# Patient Record
Sex: Female | Born: 1970
Health system: Southern US, Community
[De-identification: ages and names within clinical notes are randomized; demographics above are authoritative.]

## PROBLEM LIST (undated history)

## (undated) DIAGNOSIS — G43909 Migraine, unspecified, not intractable, without status migrainosus: Secondary | ICD-10-CM

## (undated) DIAGNOSIS — T7840XA Allergy, unspecified, initial encounter: Secondary | ICD-10-CM

## (undated) HISTORY — PX: ENDOMETRIAL ABLATION: SHX621

## (undated) HISTORY — PX: OTHER SURGICAL HISTORY: SHX169

## (undated) HISTORY — DX: Allergy, unspecified, initial encounter: T78.40XA

---

## 2003-04-19 ENCOUNTER — Emergency Department (HOSPITAL_COMMUNITY): Admission: EM | Admit: 2003-04-19 | Discharge: 2003-04-19 | Payer: Self-pay | Admitting: Emergency Medicine

## 2015-06-15 ENCOUNTER — Emergency Department (HOSPITAL_COMMUNITY)
Admission: EM | Admit: 2015-06-15 | Discharge: 2015-06-15 | Disposition: A | Discharge status: Home / Self Care | Payer: PRIVATE HEALTH INSURANCE | Source: Home / Self Care | Attending: Emergency Medicine | Admitting: Emergency Medicine

## 2015-06-15 ENCOUNTER — Encounter (HOSPITAL_COMMUNITY): Payer: Self-pay | Admitting: Emergency Medicine

## 2015-06-15 DIAGNOSIS — J0100 Acute maxillary sinusitis, unspecified: Secondary | ICD-10-CM

## 2015-06-15 HISTORY — DX: Migraine, unspecified, not intractable, without status migrainosus: G43.909

## 2015-06-15 LAB — POCT RAPID STREP A: Streptococcus, Group A Screen (Direct): NEGATIVE

## 2015-06-15 MED ORDER — BENZONATATE 100 MG PO CAPS: 100.0000 mg | ORAL_CAPSULE | Freq: Three times a day (TID) | ORAL | Status: DC | PRN

## 2015-06-15 MED ORDER — AZITHROMYCIN 250 MG PO TABS: ORAL_TABLET | ORAL | Status: DC

## 2015-06-15 MED ORDER — IPRATROPIUM BROMIDE 0.06 % NA SOLN: 2.0000 | Freq: Four times a day (QID) | NASAL | Status: DC

## 2015-06-15 NOTE — ED Notes (Signed)
C/o sinus pain States she has facial pain and pressure States she is congested, has sore throat and drainage Aleve used as tx

## 2015-06-15 NOTE — Discharge Instructions (Signed)
You have a sinus infection. Use Atrovent nasal spray 4 times a day to help dry up secretions and decreased swelling. Take azithromycin as prescribed. Use Tessalon as needed for cough. You should see improvement over the next 2 days. Follow-up as needed.

## 2015-06-15 NOTE — ED Provider Notes (Signed)
CSN: [id number]     Arrival date & time 06/15/15  1303 History   First MD Initiated Contact with Patient 06/15/15 1333     Chief Complaint  Patient presents with  . Facial Pain   (Consider location/radiation/quality/duration/timing/severity/associated sxs/prior Treatment) HPI  She is a 44 year old woman here for evaluation of sinus pain. She states for the last 8 days she has had nasal congestion and sinus pressure. This is associated with postnasal drainage and a sore throat. She also reports a cough. No ear pain, but she states her ears pop. No fevers. No nausea or vomiting. No shortness of breath. She has tried Advil Cold and Sinus, which she states typically helps, but it has not done anything.  Past Medical History  Diagnosis Date  . Migraines    Past Surgical History  Procedure Laterality Date  . Labation     History reviewed. No pertinent family history. History  Substance Use Topics  . Smoking status: Not on file  . Smokeless tobacco: Not on file  . Alcohol Use: Not on file   OB History    No data available     Review of Systems As in history of present illness Allergies  Review of patient's allergies indicates no known allergies.  Home Medications   Prior to Admission medications   Medication Sig Start Date End Date Taking? Authorizing Provider  azithromycin (ZITHROMAX Z-PAK) 250 MG tablet Take 2 pills today, then 1 pill daily until gone. 06/15/15   Melony Overly, MD  benzonatate (TESSALON) 100 MG capsule Take 1 capsule (100 mg total) by mouth 3 (three) times daily as needed for cough. 06/15/15   Melony Overly, MD  ipratropium (ATROVENT) 0.06 % nasal spray Place 2 sprays into both nostrils 4 (four) times daily. 06/15/15   Melony Overly, MD   BP 145/94 mmHg  Pulse 80  Temp(Src) 98 F (36.7 C) (Oral)  Resp 16  SpO2 96% Physical Exam  Constitutional: She is oriented to person, place, and time. She appears well-developed and well-nourished. No distress.  HENT:   Mouth/Throat: No oropharyngeal exudate.  Clear post nasal drip seen. Nasal mucosa is erythematous and boggy. Right TM is retracted, left TM is normal. Bilateral maxillary tenderness. No frontal tenderness.  Eyes: Conjunctivae are normal.  Neck: Neck supple.  Cardiovascular: Normal rate, regular rhythm and normal heart sounds.   No murmur heard. Pulmonary/Chest: Effort normal and breath sounds normal. No respiratory distress. She has no wheezes. She has no rales.  Lymphadenopathy:    She has no cervical adenopathy.  Neurological: She is alert and oriented to person, place, and time.    ED Course  Procedures (including critical care time) Labs Review Labs Reviewed  POCT RAPID STREP A    Imaging Review No results found.   MDM   1. Acute maxillary sinusitis, recurrence not specified    Treatment with Atrovent, azithromycin, and Tessalon. Follow-up as needed.    Melony Overly, MD 06/15/15 [phone]

## 2015-06-17 LAB — CULTURE, GROUP A STREP: Strep A Culture: NEGATIVE

## 2015-06-17 NOTE — ED Notes (Signed)
Final report of strep test negative

## 2019-03-19 ENCOUNTER — Encounter: Payer: Self-pay | Admitting: Sports Medicine

## 2019-03-19 ENCOUNTER — Ambulatory Visit
Admission: RE | Admit: 2019-03-19 | Discharge: 2019-03-19 | Disposition: A | Discharge status: Home / Self Care | Payer: PRIVATE HEALTH INSURANCE | Source: Ambulatory Visit | Attending: Sports Medicine | Admitting: Sports Medicine

## 2019-03-19 ENCOUNTER — Ambulatory Visit: Payer: PRIVATE HEALTH INSURANCE | Admitting: Sports Medicine

## 2019-03-19 ENCOUNTER — Other Ambulatory Visit: Payer: Self-pay

## 2019-03-19 VITALS — BP 122/78 | Ht 66.0 in | Wt 182.0 lb

## 2019-03-19 DIAGNOSIS — M25561 Pain in right knee: Secondary | ICD-10-CM

## 2019-03-19 MED ORDER — MELOXICAM 15 MG PO TABS: ORAL_TABLET | ORAL | 0 refills | Status: DC

## 2019-03-19 NOTE — Addendum Note (Signed)
Addended by: Cyd Silence on: 03/19/2019 09:44 AM   Modules accepted: Orders

## 2019-03-19 NOTE — Progress Notes (Addendum)
   Subjective:    Patient ID: Christine Estrada, female    DOB: [birth year], 48 y.o.   MRN: [id number]  HPI chief complaint: Right knee pain  Very pleasant 48 year old female comes in today after having injured her right knee about 1 week ago.  While doing a Cross[employer] workout, she did a backwards lunge with the right leg and felt a painful pop in the medial aspect of the right knee.  Since that time she is had persistent pain with certain activities.  She notes that she is able to run with minimal discomfort but walking is bothersome.  She also notes that any sort of valgus force on the knee will cause discomfort.  She has not noticed any swelling but does endorse stiffness in the posterior knee.  She denies problems with this knee in the past.  No prior knee surgeries.  No specific treatment to date.  Past medical history reviewed Medications reviewed Allergies reviewed    Review of Systems As above    Objective:   Physical Exam  Well-developed, well-nourished.  No acute distress.  Awake alert and oriented x3.  Vital signs reviewed  Right knee: Full range of motion.  No obvious effusion.  She is tender to palpation along the medial joint line with a positive McMurray's.  Positive medial Thessaly's.  Knee is stable to valgus and varus stressing.  Negative anterior drawer, negative posterior drawer.  1+ patellofemoral crepitus.  Neurovascular intact distally.  Bedside ultrasound was performed.  Patient has a trace joint effusion.  Lateral meniscus was difficult to visualize but there does appear to be fluid at the lateral joint space.       Assessment & Plan:   Right knee pain likely secondary to medial meniscal tear  We will start conservatively with Mobic 15 mg daily for 7 days, body helix compression sleeve with activity, and isometric quad exercises.  Patient will modify her workouts using pain as her guide and will follow-up with me in 3 weeks for reevaluation.  I would like to go  ahead and get an x-ray of her knee to rule out osteoarthritis.  If symptoms persist, consider merits of further diagnostic imaging to rule out medial meniscal tear.  Addendum: X-ray unremarkable

## 2019-04-14 ENCOUNTER — Ambulatory Visit: Payer: PRIVATE HEALTH INSURANCE | Admitting: Sports Medicine

## 2019-04-15 ENCOUNTER — Ambulatory Visit: Payer: PRIVATE HEALTH INSURANCE | Admitting: Sports Medicine

## 2019-04-15 ENCOUNTER — Encounter: Payer: Self-pay | Admitting: Sports Medicine

## 2019-04-15 ENCOUNTER — Other Ambulatory Visit: Payer: Self-pay

## 2019-04-15 VITALS — BP 118/84 | Ht 66.0 in | Wt 180.0 lb

## 2019-04-15 DIAGNOSIS — M25561 Pain in right knee: Secondary | ICD-10-CM | POA: Diagnosis not present

## 2019-04-15 MED ORDER — MELOXICAM 15 MG PO TABS: ORAL_TABLET | ORAL | 0 refills | Status: DC

## 2019-04-15 NOTE — Progress Notes (Signed)
   Subjective:    Patient ID: Christine Estrada, female    DOB: Dec 11, 1970, 48 y.o.   MRN: [id number]  HPI  Patient comes in today for follow-up on a right knee injury which she suffered last month.  Overall, she feels like her symptoms are improving.  She still endorses a painful pop along the medial knee with walking occasionally.  Still having pain with certain exercises such as backward lunges.  The meloxicam has been helpful.  The stiffness she was experiencing previously has resolved with the meloxicam.  He is taking it about 4 times a week.  She denies stomach upset.  She has not noticed any swelling around the knee.  She does wear the body helix compression sleeve when exercising and has been doing her home exercises.   Review of Systems    As above Objective:   Physical Exam  Well-developed, well-nourished.  No acute distress.  Awake alert and oriented x3.  Vital signs reviewed.  Right knee: Full range of motion.  No obvious effusion.  She remains tender to palpation along the medial joint line with a positive McMurray's and a positive Thessaly's.  No tenderness along the lateral joint line.  Knee is grossly stable to ligamentous exam.  Neurovascularly intact distally.  Walking without an obvious limp.  X-rays of the right knee including AP, lateral, and sunrise views show minimal degenerative changes.  Nothing acute.      Assessment & Plan:   Right knee pain likely secondary to medial meniscal tear  I discussed work-up and treatment including continuing with her current plan of oral anti-inflammatories, compression sleeve, home exercises, and activity modification versus further diagnostic imaging in the form of an MRI.  For now, we are going to continue with meloxicam taking it as needed with food.  She will continue with her body helix compression sleeve and her home exercise program.  If her symptoms do not continue to improve over the next 3 to 4 weeks, she will notify me and  we will reconsider merits of an MRI.  Follow-up as needed.

## 2019-10-21 DIAGNOSIS — F329 Major depressive disorder, single episode, unspecified: Secondary | ICD-10-CM | POA: Diagnosis not present

## 2019-10-21 DIAGNOSIS — F419 Anxiety disorder, unspecified: Secondary | ICD-10-CM | POA: Diagnosis not present

## 2019-10-21 DIAGNOSIS — Z01419 Encounter for gynecological examination (general) (routine) without abnormal findings: Secondary | ICD-10-CM | POA: Diagnosis not present

## 2019-10-21 DIAGNOSIS — N951 Menopausal and female climacteric states: Secondary | ICD-10-CM | POA: Diagnosis not present

## 2019-10-21 DIAGNOSIS — R635 Abnormal weight gain: Secondary | ICD-10-CM | POA: Diagnosis not present

## 2019-10-21 DIAGNOSIS — Z124 Encounter for screening for malignant neoplasm of cervix: Secondary | ICD-10-CM | POA: Diagnosis not present

## 2019-10-21 DIAGNOSIS — Z1151 Encounter for screening for human papillomavirus (HPV): Secondary | ICD-10-CM | POA: Diagnosis not present

## 2019-10-21 LAB — HM PAP SMEAR: HM Pap smear: NEGATIVE

## 2020-08-01 DIAGNOSIS — Z20828 Contact with and (suspected) exposure to other viral communicable diseases: Secondary | ICD-10-CM | POA: Diagnosis not present

## 2020-11-30 DIAGNOSIS — Z1231 Encounter for screening mammogram for malignant neoplasm of breast: Secondary | ICD-10-CM | POA: Diagnosis not present

## 2020-12-09 ENCOUNTER — Encounter: Payer: Self-pay | Admitting: Family Medicine

## 2020-12-21 DIAGNOSIS — R928 Other abnormal and inconclusive findings on diagnostic imaging of breast: Secondary | ICD-10-CM | POA: Diagnosis not present

## 2020-12-21 LAB — HM MAMMOGRAPHY

## 2020-12-24 IMAGING — CR RIGHT KNEE - COMPLETE 4+ VIEW
4 series · 4 of 4 positions shown · non-contrast
Comparison: None.

CLINICAL DATA: Pain in knee.

EXAM:
RIGHT KNEE - COMPLETE 4+ VIEW

[w knee ap right]
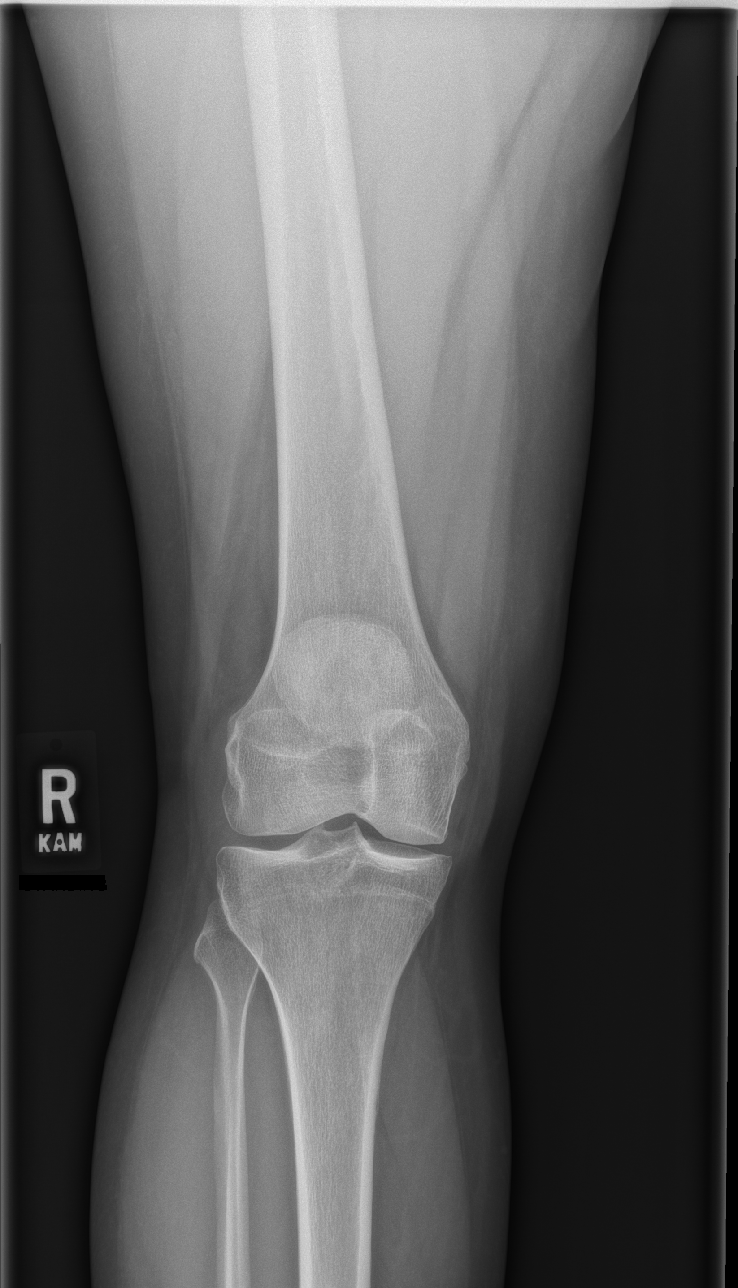

[w knee lat right]
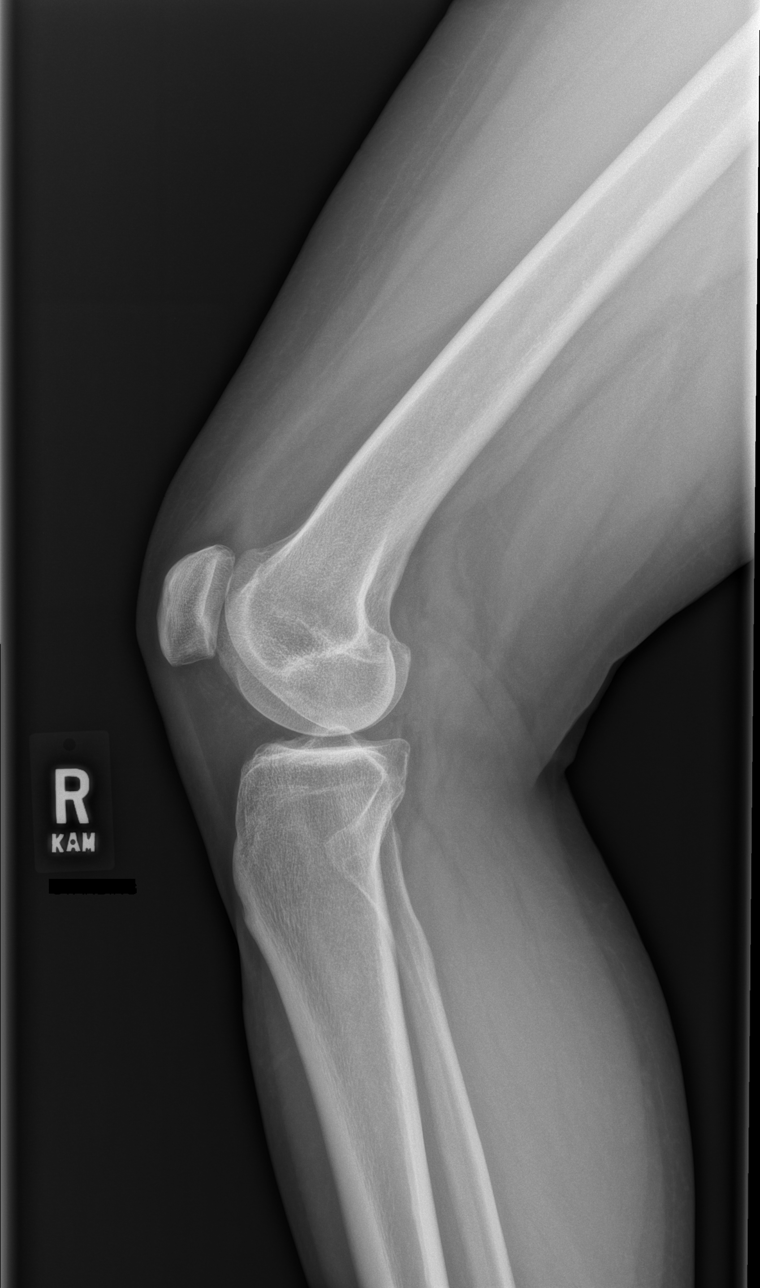

[w knee tunnel pa right]
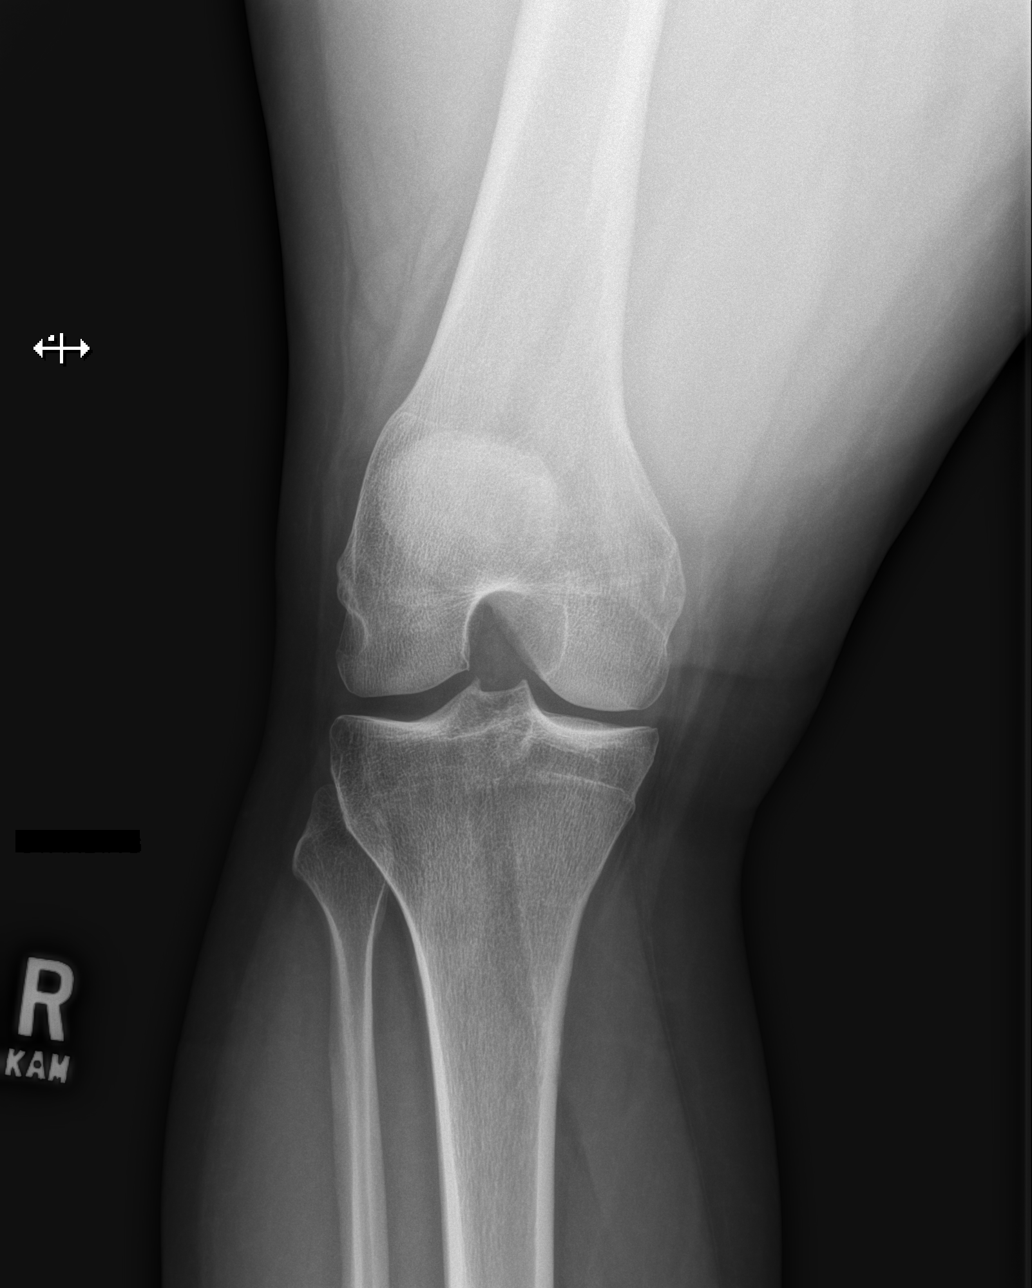

[x knee sunrise right]
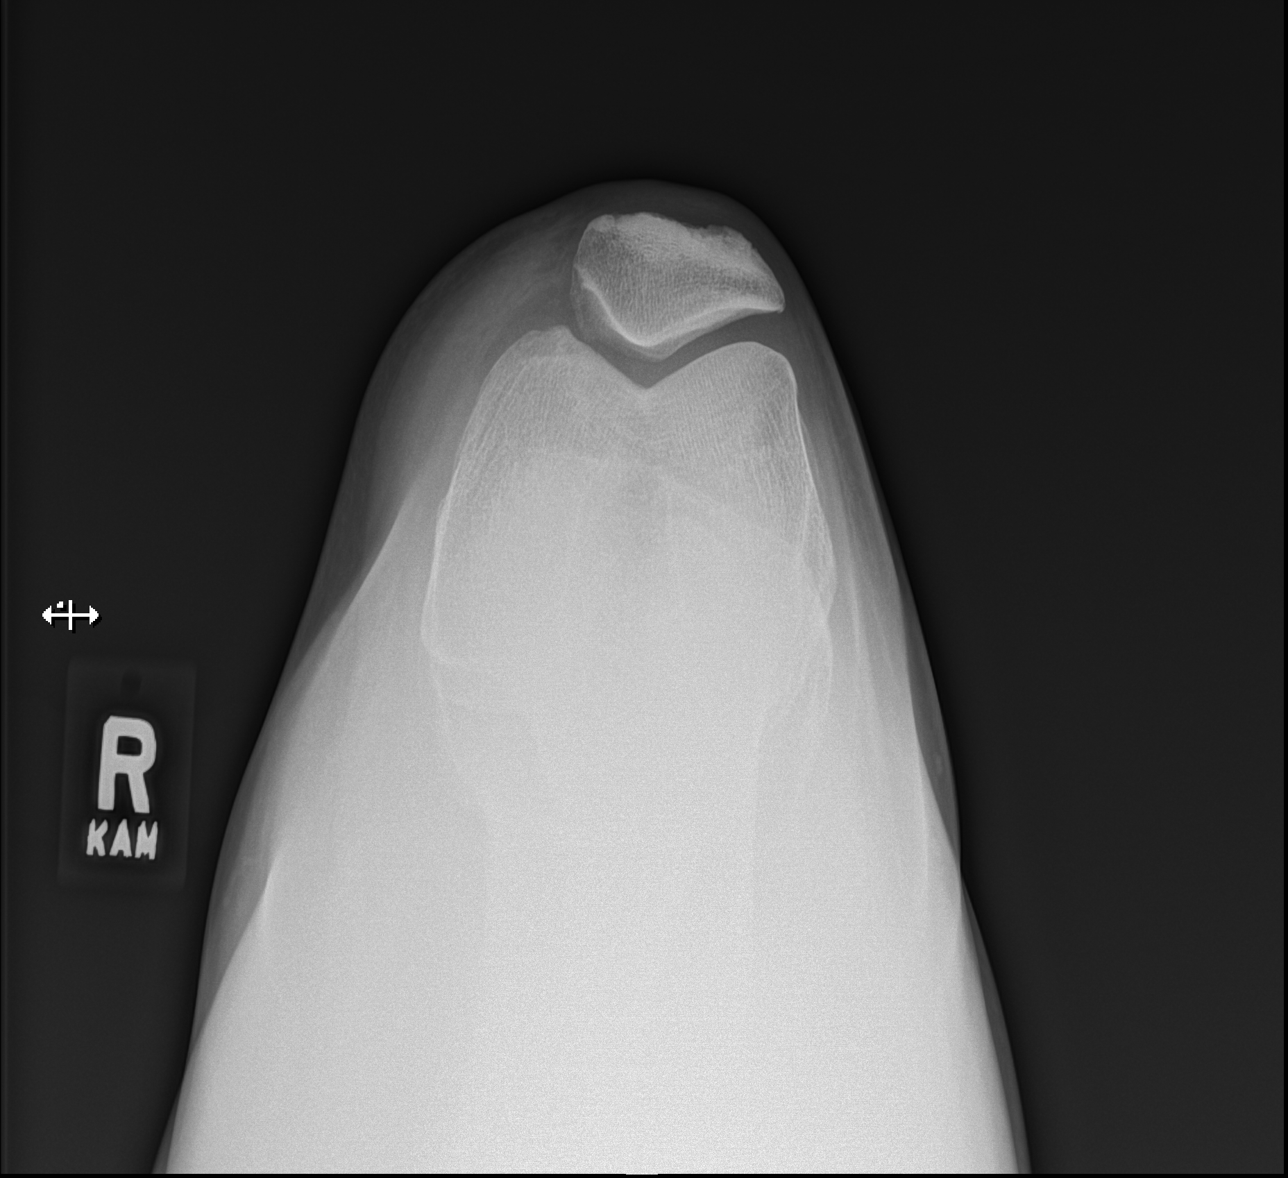

[4 of 4 positions shown; findings below may reference images not displayed]

FINDINGS: No evidence of fracture, dislocation, or joint effusion. No evidence
of arthropathy or other focal bone abnormality. Soft tissues are
unremarkable.
IMPRESSION: Negative.

## 2020-12-29 ENCOUNTER — Ambulatory Visit: Payer: Self-pay | Admitting: Family Medicine

## 2020-12-29 ENCOUNTER — Encounter: Payer: Self-pay | Admitting: Family Medicine

## 2020-12-29 ENCOUNTER — Encounter: Payer: Self-pay | Admitting: Internal Medicine

## 2021-01-12 ENCOUNTER — Encounter: Payer: Self-pay | Admitting: Family Medicine

## 2021-01-12 ENCOUNTER — Other Ambulatory Visit: Payer: Self-pay

## 2021-01-12 ENCOUNTER — Ambulatory Visit: Payer: BC Managed Care – PPO | Admitting: Family Medicine

## 2021-01-12 VITALS — BP 120/80 | HR 68 | Ht 66.75 in | Wt 203.6 lb

## 2021-01-12 DIAGNOSIS — E559 Vitamin D deficiency, unspecified: Secondary | ICD-10-CM | POA: Diagnosis not present

## 2021-01-12 DIAGNOSIS — B001 Herpesviral vesicular dermatitis: Secondary | ICD-10-CM

## 2021-01-12 DIAGNOSIS — R4586 Emotional lability: Secondary | ICD-10-CM | POA: Diagnosis not present

## 2021-01-12 DIAGNOSIS — N951 Menopausal and female climacteric states: Secondary | ICD-10-CM

## 2021-01-12 DIAGNOSIS — Z8639 Personal history of other endocrine, nutritional and metabolic disease: Secondary | ICD-10-CM | POA: Diagnosis not present

## 2021-01-12 DIAGNOSIS — R229 Localized swelling, mass and lump, unspecified: Secondary | ICD-10-CM

## 2021-01-12 DIAGNOSIS — R61 Generalized hyperhidrosis: Secondary | ICD-10-CM | POA: Diagnosis not present

## 2021-01-12 LAB — CBC WITH DIFFERENTIAL/PLATELET
Basos: 1 %
Immature Grans (Abs): 0 10*3/uL (ref 0.0–0.1)
Neutrophils: 63 %
Platelets: 322 10*3/uL (ref 150–450)

## 2021-01-12 LAB — COMPREHENSIVE METABOLIC PANEL

## 2021-01-12 MED ORDER — VALACYCLOVIR HCL 1 G PO TABS: ORAL_TABLET | ORAL | 4 refills | Status: AC

## 2021-01-12 NOTE — Patient Instructions (Addendum)
Try Janey Genta for hot flashes    Obgyn Offices:   [facility] [address] [place], [place] [id number]  [facility] Address: [address] [place], [place] [address] Phone: [phone]  [facility] [address] [place], [place] [address] Phone: [phone]  [place], [place] [address] Phone: [phone]

## 2021-01-12 NOTE — Progress Notes (Signed)
Subjective:    Patient ID: Christine Estrada, female    DOB: Sep 05, 1971, 50 y.o.   MRN: [id number]  HPI Chief Complaint  Patient presents with  . new pt get established    New pt obgyn out her on syntheic hormones and had a bunch of weight gain so stopped, mammogram was abnormal   She is new to the practice and here to establish care.  Previous medical care: at her OB/GYN in [place].   States she is going through menopause. Reports being worked up for this at her OB/GYN office and trying HRT. States she is no longer on HRT and is having hot flashes, night sweats, decreased libido and mood swings. She also reports having "menopause brain". She has also gained weight and not happy with this. Stats she gained 20 lbs while on hormone treatment.  She plans to establish with a new OB/GYN in [place].   No periods since ablation in 2004.   States she has a knot that appearred after shaving one her left axilla one week ago. Denies tenderness, redness or enlarging area.   Reports mammogram at [facility] was abnormal on right side. States she has a repeat mammogram and [place] on right breast scheduled for August 2022   States she was diagnosed with abnormal thyroid function at one point and was on medication for a brief time. Cannot recall the medication.    She also reports a history of vitamin D deficiency. Would like to recheck this.    Requests refill of Valtrex. States she takes this for cold sores on her lips and typically has 3-4 per year.   Married, 2 kids ages 18 and 64. Works as an Glass blower/designer.      Review of Systems Pertinent positives and negatives in the history of present illness.     Objective:   Physical Exam Constitutional:      General: She is not in acute distress.    Appearance: Normal appearance. She is not ill-appearing.  Eyes:     Conjunctiva/sclera: Conjunctivae normal.  Cardiovascular:     Rate and Rhythm: Normal rate and regular rhythm.     Pulses: Normal  pulses.     Heart sounds: Normal heart sounds.  Pulmonary:     Effort: Pulmonary effort is normal.     Breath sounds: Normal breath sounds.  Chest:  Breasts:     Right: No axillary adenopathy or supraclavicular adenopathy.     Left: No axillary adenopathy or supraclavicular adenopathy.      Comments: Pea sized, round, non tender superficial mass in her left axilla. No erythema.  Musculoskeletal:     Cervical back: Normal range of motion and neck supple.  Lymphadenopathy:     Cervical: No cervical adenopathy.     Upper Body:     Right upper body: No supraclavicular or axillary adenopathy.     Left upper body: No supraclavicular or axillary adenopathy.  Skin:    General: Skin is warm and dry.  Neurological:     General: No focal deficit present.     Mental Status: She is alert and oriented to person, place, and time.  Psychiatric:        Mood and Affect: Mood normal.        Thought Content: Thought content normal.        Judgment: Judgment normal.    BP 120/80   Pulse 68   Ht 5' 6.75" (1.695 m)   Wt 203 lb 9.6  oz (92.4 kg)   BMI 32.13 kg/m       Assessment & Plan:  Hot flashes due to menopause - Plan: CBC with Differential/Platelet, Comprehensive metabolic panel, TSH, T4, free -check labs. She may try OTC Black Cohosh since she has not tried it. Sh ewill call ans schedule with a new OB/GYN and I provided her with a list.   Herpes labialis - Plan: valACYclovir (VALTREX) 1000 MG tablet -3-4 outbreaks per year. Refilled medication.   Vitamin D deficiency - Plan: VITAMIN D 25 Hydroxy (Vit-D Deficiency, Fractures) -check labs and follow up  History of thyroid disorder - Plan: TSH, T4, free -check labs and follow up  Night sweats - Plan: CBC with Differential/Platelet, Comprehensive metabolic panel, TSH, T4, free  Mood changes - Plan: CBC with Differential/Platelet, Comprehensive metabolic panel, TSH, T4, free -appears to be hormonal related and will schedule with gyn to  discuss options for treatment. We may consider Effexor.   Mass of skin -this appears benign and has been present for one week. She will keep an eye on this and let me know if it is getting larger or changing in any way.

## 2021-01-13 ENCOUNTER — Other Ambulatory Visit: Payer: Self-pay | Admitting: Family Medicine

## 2021-01-13 DIAGNOSIS — E559 Vitamin D deficiency, unspecified: Secondary | ICD-10-CM

## 2021-01-13 LAB — COMPREHENSIVE METABOLIC PANEL
ALT: 20 IU/L (ref 0–32)
AST: 21 IU/L (ref 0–40)
Albumin: 4.7 g/dL (ref 3.8–4.8)
Alkaline Phosphatase: 74 IU/L (ref 44–121)
BUN: 15 mg/dL (ref 6–24)
Bilirubin Total: 0.3 mg/dL (ref 0.0–1.2)
Chloride: 101 mmol/L (ref 96–106)
Creatinine, Ser: 0.96 mg/dL (ref 0.57–1.00)
GFR calc Af Amer: 81 mL/min/{1.73_m2} (ref >59)
GFR calc non Af Amer: 71 mL/min/{1.73_m2} (ref >59)
Globulin, Total: 2.8 g/dL (ref 1.5–4.5)
Glucose: 91 mg/dL (ref 65–99)
Potassium: 4.7 mmol/L (ref 3.5–5.2)
Sodium: 141 mmol/L (ref 134–144)
Total Protein: 7.5 g/dL (ref 6.0–8.5)

## 2021-01-13 LAB — CBC WITH DIFFERENTIAL/PLATELET
Basophils Absolute: 0 10*3/uL (ref 0.0–0.2)
EOS (ABSOLUTE): 0.4 10*3/uL (ref 0.0–0.4)
Eos: 6 %
Hematocrit: 42.6 % (ref 34.0–46.6)
Hemoglobin: 13.6 g/dL (ref 11.1–15.9)
Immature Granulocytes: 0 %
Lymphocytes Absolute: 1.7 10*3/uL (ref 0.7–3.1)
Lymphs: 24 %
MCH: 26.4 pg — ABNORMAL LOW (ref 26.6–33.0)
MCHC: 31.9 g/dL (ref 31.5–35.7)
MCV: 83 fL (ref 79–97)
Monocytes Absolute: 0.5 10*3/uL (ref 0.1–0.9)
Monocytes: 6 %
Neutrophils Absolute: 4.5 10*3/uL (ref 1.4–7.0)
RBC: 5.15 x10E6/uL (ref 3.77–5.28)
RDW: 13.1 % (ref 11.7–15.4)
WBC: 7.2 10*3/uL (ref 3.4–10.8)

## 2021-01-13 LAB — VITAMIN D 25 HYDROXY (VIT D DEFICIENCY, FRACTURES): Vit D, 25-Hydroxy: 21.1 ng/mL — ABNORMAL LOW (ref 30.0–100.0)

## 2021-01-13 LAB — TSH: TSH: 5.86 u[IU]/mL — ABNORMAL HIGH (ref 0.450–4.500)

## 2021-01-13 LAB — T4, FREE: Free T4: 1.28 ng/dL (ref 0.82–1.77)

## 2021-01-13 MED ORDER — VITAMIN D (ERGOCALCIFEROL) 1.25 MG (50000 UNIT) PO CAPS: 50000.0000 [IU] | ORAL_CAPSULE | ORAL | 0 refills | Status: AC

## 2021-01-13 NOTE — Progress Notes (Signed)
I spoke with patient via telephone

## 2021-02-05 NOTE — Progress Notes (Signed)
Subjective:    Patient ID: Christine Estrada, female    DOB: Sep 30, 1971, 50 y.o.   MRN: [id number]  HPI Chief Complaint  Patient presents with  . nonfasting    Nonfasting cpe, had half cup of coffee this am.needs form completed for insurnace Sees obgyn. No concerns.    She is here for a complete physical exam. Last CPE: years ago other than with her OB/GYN  Other providers: OB/GYN   States she had Covid and had antibodies when checked. Does not want the vaccine.   Social history: married, 2 children, 1 daughter plays softball at [employer] works as an Glass blower/designer.  Diet: low sugar and fairly healthy. No fast food  Excerise: doing burn boot camp 2 days per week   Immunizations: Tdap more than 10 years   Health maintenance:  Mammogram: [facility]. Scheduled again in August 2022 Colonoscopy: never  Last Gynecological Exam: 2020 and looking for new OB/GYN Last Dental Exam: UTD Last Eye Exam: UTD  Wears seatbelt always, uses sunscreen, smoke detectors in home and functioning, does not text while driving and feels safe in home environment.   Reviewed allergies, medications, past medical, surgical, family, and social history.   Review of Systems Review of Systems Constitutional: -fever, -chills, -sweats, -unexpected weight change,-fatigue ENT: -runny nose, -ear pain, -sore throat Cardiology:  -chest pain, -palpitations, -edema Respiratory: -cough, -shortness of breath, -wheezing Gastroenterology: -abdominal pain, -nausea, -vomiting, -diarrhea, -constipation  Hematology: -bleeding or bruising problems Musculoskeletal: -arthralgias, -myalgias, -joint swelling, -back pain Ophthalmology: -vision changes Urology: -dysuria, -difficulty urinating, -hematuria, -urinary frequency, -urgency Neurology: -headache, -weakness, -tingling, -numbness       Objective:   Physical Exam BP 124/78   Pulse 71   Ht 5' 6.75" (1.695 m)   Wt 200 lb 9.6 oz (91 kg)   BMI 31.65 kg/m   General  Appearance:    Alert, cooperative, no distress, appears stated age  Head:    Normocephalic, without obvious abnormality, atraumatic  Eyes:    PERRL, conjunctiva/corneas clear, EOM's intact  Ears:    Normal TM's and external ear canals  Nose:   Mask on   Throat:   Mask on   Neck:   Supple, no lymphadenopathy;  thyroid: Nodule on the right thyroid palpated with history of the same per patient, nontender.  No JVD  Back:    Spine nontender, no curvature, ROM normal, no CVA     tenderness  Lungs:     Clear to auscultation bilaterally without wheezes, rales or     ronchi; respirations unlabored  Chest Wall:    No tenderness or deformity   Heart:    Regular rate and rhythm, S1 and S2 normal, no murmur, rub   or gallop  Breast Exam:    OB/GYN  Abdomen:     Soft, non-tender, nondistended, normoactive bowel sounds,    no masses, no hepatosplenomegaly  Genitalia:    OB/GYN     Extremities:   No clubbing, cyanosis or edema  Pulses:   2+ and symmetric all extremities  Skin:   Skin color, texture, turgor normal, no rashes or lesions  Lymph nodes:   Cervical, supraclavicular, and axillary nodes normal  Neurologic:   CNII-XII intact, normal strength, sensation and gait          Psych:   Normal mood, affect, hygiene and grooming.         Assessment & Plan:  Routine general medical examination at a health care facility - Plan: Lipid  panel -She is fairly new to the practice and here today for a fasting CPE.  Preventive health care reviewed.  She is in the process of finding a new OB/GYN.  Mammogram is up-to-date.  She has never had a screening colonoscopy which I will refer her for today.  Counseled on healthy lifestyle including diet and exercise.  Recommend regular dental and eye exams.  Immunizations reviewed.  She declines COVID vaccine.  Tdap updated.  Discussed safety and health promotion.  Screen for colon cancer - Plan: Ambulatory referral to Gastroenterology -She is asymptomatic and this will  be her first screening colonoscopy  Need for diphtheria-tetanus-pertussis (Tdap) vaccine - Plan: Tdap vaccine greater than or equal to 7yo IM -Counseling done on all components of the vaccine  Need for hepatitis C screening test - Plan: Hepatitis C antibody -Done per screening guidelines  Elevated TSH - Plan: TSH, T4, free, T3, Thyroid peroxidase antibody -Follow-up pending lab results  Screening for lipid disorders - Plan: Lipid panel  Right thyroid nodule -Asymptomatic . review of thyroid ultrasound from 2014 showing a benign lymph node.  Check thyroid studies.  Vitamin D deficiency -She is currently taking prescription vitamin D.  When she completes prescription she will start taking vitamin D 3 over-the-counter.

## 2021-02-05 NOTE — Patient Instructions (Incomplete)
You will hear from [facility].   Schedule with an OB/GYN as discussed.      Preventive Care 48-47 Years Old, Female Preventive care refers to lifestyle choices and visits with your health care provider that can promote health and wellness. This includes:  A yearly physical exam. This is also called an annual wellness visit.  Regular dental and eye exams.  Immunizations.  Screening for certain conditions.  Healthy lifestyle choices, such as: ? Eating a healthy diet. ? Getting regular exercise. ? Not using drugs or products that contain nicotine and tobacco. ? Limiting alcohol use. What can I expect for my preventive care visit? Physical exam Your health care provider will check your:  Height and weight. These may be used to calculate your BMI (body mass index). BMI is a measurement that tells if you are at a healthy weight.  Heart rate and blood pressure.  Body temperature.  Skin for abnormal spots. Counseling Your health care provider may ask you questions about your:  Past medical problems.  Family's medical history.  Alcohol, tobacco, and drug use.  Emotional well-being.  Home life and relationship well-being.  Sexual activity.  Diet, exercise, and sleep habits.  Work and work Statistician.  Access to firearms.  Method of birth control.  Menstrual cycle.  Pregnancy history. What immunizations do I need? Vaccines are usually given at various ages, according to a schedule. Your health care provider will recommend vaccines for you based on your age, medical history, and lifestyle or other factors, such as travel or where you work.   What tests do I need? Blood tests  Lipid and cholesterol levels. These may be checked every 5 years, or more often if you are over 21 years old.  Hepatitis C test.  Hepatitis B test. Screening  Lung cancer screening. You may have this screening every year starting at age 48 if you have a 30-pack-year  history of smoking and currently smoke or have quit within the past 15 years.  Colorectal cancer screening. ? All adults should have this screening starting at age 48 and continuing until age 3. ? Your health care provider may recommend screening at age 48 if you are at increased risk. ? You will have tests every 1-10 years, depending on your results and the type of screening test.  Diabetes screening. ? This is done by checking your blood sugar (glucose) after you have not eaten for a while (fasting). ? You may have this done every 1-3 years.  Mammogram. ? This may be done every 1-2 years. ? Talk with your health care provider about when you should start having regular mammograms. This may depend on whether you have a family history of breast cancer.  BRCA-related cancer screening. This may be done if you have a family history of breast, ovarian, tubal, or peritoneal cancers.  Pelvic exam and Pap test. ? This may be done every 3 years starting at age 48. ? Starting at age 48, this may be done every 5 years if you have a Pap test in combination with an HPV test. Other tests  STD (sexually transmitted disease) testing, if you are at risk.  Bone density scan. This is done to screen for osteoporosis. You may have this scan if you are at high risk for osteoporosis. Talk with your health care provider about your test results, treatment options, and if necessary, the need for more tests. Follow these instructions at home: Eating and drinking  Eat a diet that  includes fresh fruits and vegetables, whole grains, lean protein, and low-fat dairy products.  Take vitamin and mineral supplements as recommended by your health care provider.  Do not drink alcohol if: ? Your health care provider tells you not to drink. ? You are pregnant, may be pregnant, or are planning to become pregnant.  If you drink alcohol: ? Limit how much you have to 0-1 drink a day. ? Be aware of how much alcohol is  in your drink. In the [place] one drink equals one 12 oz bottle of beer (355 mL), one 5 oz glass of wine (148 mL), or one 1 oz glass of hard liquor (44 mL).   Lifestyle  Take daily care of your teeth and gums. Brush your teeth every morning and night with fluoride toothpaste. Floss one time each day.  Stay active. Exercise for at least 30 minutes 5 or more days each week.  Do not use any products that contain nicotine or tobacco, such as cigarettes, e-cigarettes, and chewing tobacco. If you need help quitting, ask your health care provider.  Do not use drugs.  If you are sexually active, practice safe sex. Use a condom or other form of protection to prevent STIs (sexually transmitted infections).  If you do not wish to become pregnant, use a form of birth control. If you plan to become pregnant, see your health care provider for a prepregnancy visit.  If told by your health care provider, take low-dose aspirin daily starting at age 48.  Find healthy ways to cope with stress, such as: ? Meditation, yoga, or listening to music. ? Journaling. ? Talking to a trusted person. ? Spending time with friends and family. Safety  Always wear your seat belt while driving or riding in a vehicle.  Do not drive: ? If you have been drinking alcohol. Do not ride with someone who has been drinking. ? When you are tired or distracted. ? While texting.  Wear a helmet and other protective equipment during sports activities.  If you have firearms in your house, make sure you follow all gun safety procedures. What's next?  Visit your health care provider once a year for an annual wellness visit.  Ask your health care provider how often you should have your eyes and teeth checked.  Stay up to date on all vaccines. This information is not intended to replace advice given to you by your health care provider. Make sure you discuss any questions you have with your health care provider. Document Revised:  08/09/2020 Document Reviewed: 07/17/2018 Elsevier Patient Education  2021 [employer].

## 2021-02-06 ENCOUNTER — Encounter: Payer: Self-pay | Admitting: Family Medicine

## 2021-02-06 ENCOUNTER — Ambulatory Visit: Payer: BC Managed Care – PPO | Admitting: Family Medicine

## 2021-02-06 ENCOUNTER — Other Ambulatory Visit: Payer: Self-pay

## 2021-02-06 VITALS — BP 124/78 | HR 71 | Ht 66.75 in | Wt 200.6 lb

## 2021-02-06 DIAGNOSIS — Z13220 Encounter for screening for lipoid disorders: Secondary | ICD-10-CM

## 2021-02-06 DIAGNOSIS — R7989 Other specified abnormal findings of blood chemistry: Secondary | ICD-10-CM | POA: Insufficient documentation

## 2021-02-06 DIAGNOSIS — Z23 Encounter for immunization: Secondary | ICD-10-CM

## 2021-02-06 DIAGNOSIS — Z1159 Encounter for screening for other viral diseases: Secondary | ICD-10-CM | POA: Diagnosis not present

## 2021-02-06 DIAGNOSIS — Z0000 Encounter for general adult medical examination without abnormal findings: Secondary | ICD-10-CM

## 2021-02-06 DIAGNOSIS — E041 Nontoxic single thyroid nodule: Secondary | ICD-10-CM | POA: Insufficient documentation

## 2021-02-06 DIAGNOSIS — Z1211 Encounter for screening for malignant neoplasm of colon: Secondary | ICD-10-CM

## 2021-02-06 DIAGNOSIS — E559 Vitamin D deficiency, unspecified: Secondary | ICD-10-CM

## 2021-02-07 ENCOUNTER — Encounter: Payer: Self-pay | Admitting: Family Medicine

## 2021-02-07 LAB — T3: T3, Total: 130 ng/dL (ref 71–180)

## 2021-02-07 LAB — T4, FREE: Free T4: 1.26 ng/dL (ref 0.82–1.77)

## 2021-02-07 LAB — LIPID PANEL
Chol/HDL Ratio: 4.3 ratio (ref 0.0–4.4)
Cholesterol, Total: 243 mg/dL — ABNORMAL HIGH (ref 100–199)
HDL: 57 mg/dL (ref >39)
LDL Chol Calc (NIH): 163 mg/dL — ABNORMAL HIGH (ref 0–99)
Triglycerides: 131 mg/dL (ref 0–149)
VLDL Cholesterol Cal: 23 mg/dL (ref 5–40)

## 2021-02-07 LAB — THYROID PEROXIDASE ANTIBODY: Thyroperoxidase Ab SerPl-aCnc: 76 IU/mL — ABNORMAL HIGH (ref 0–34)

## 2021-02-07 LAB — HEPATITIS C ANTIBODY: Hep C Virus Ab: <0.1 s/co ratio (ref 0.0–0.9)

## 2021-02-07 LAB — TSH: TSH: 5.11 u[IU]/mL — ABNORMAL HIGH (ref 0.450–4.500)

## 2021-02-08 ENCOUNTER — Other Ambulatory Visit: Payer: Self-pay | Admitting: Family Medicine

## 2021-02-08 DIAGNOSIS — R7989 Other specified abnormal findings of blood chemistry: Secondary | ICD-10-CM

## 2021-02-08 DIAGNOSIS — Z79899 Other long term (current) drug therapy: Secondary | ICD-10-CM

## 2021-02-08 MED ORDER — LEVOTHYROXINE SODIUM 50 MCG PO TABS: 50.0000 ug | ORAL_TABLET | Freq: Every day | ORAL | 1 refills | Status: DC

## 2021-02-15 ENCOUNTER — Encounter: Payer: Self-pay | Admitting: Family Medicine

## 2021-02-24 ENCOUNTER — Encounter: Payer: Self-pay | Admitting: Internal Medicine

## 2021-03-13 ENCOUNTER — Other Ambulatory Visit: Payer: Self-pay

## 2021-03-13 ENCOUNTER — Other Ambulatory Visit: Payer: BC Managed Care – PPO

## 2021-03-13 DIAGNOSIS — Z79899 Other long term (current) drug therapy: Secondary | ICD-10-CM

## 2021-03-13 DIAGNOSIS — R7989 Other specified abnormal findings of blood chemistry: Secondary | ICD-10-CM

## 2021-03-14 ENCOUNTER — Other Ambulatory Visit: Payer: Self-pay | Admitting: Internal Medicine

## 2021-03-14 LAB — TSH: TSH: 4.22 u[IU]/mL (ref 0.450–4.500)

## 2021-03-14 MED ORDER — LEVOTHYROXINE SODIUM 50 MCG PO TABS: 50.0000 ug | ORAL_TABLET | Freq: Every day | ORAL | 5 refills | Status: DC

## 2021-03-14 NOTE — Progress Notes (Signed)
Her TSH is normal and ok to continue on the current dose of levothyroxine

## 2021-04-11 ENCOUNTER — Encounter: Payer: Self-pay | Admitting: Family Medicine

## 2021-04-17 ENCOUNTER — Encounter: Payer: Self-pay | Admitting: Family Medicine

## 2021-05-31 ENCOUNTER — Encounter: Payer: Self-pay | Admitting: Gastroenterology

## 2021-06-07 ENCOUNTER — Telehealth: Payer: Self-pay | Admitting: Gastroenterology

## 2021-06-07 ENCOUNTER — Ambulatory Visit (AMBULATORY_SURGERY_CENTER): Payer: Self-pay

## 2021-06-07 ENCOUNTER — Other Ambulatory Visit: Payer: Self-pay

## 2021-06-07 VITALS — Ht 66.75 in | Wt 194.0 lb

## 2021-06-07 DIAGNOSIS — Z1211 Encounter for screening for malignant neoplasm of colon: Secondary | ICD-10-CM

## 2021-06-07 MED ORDER — PEG-KCL-NACL-NASULF-NA ASC-C 100 G PO SOLR: 1.0000 | Freq: Once | ORAL | 0 refills | Status: AC

## 2021-06-07 NOTE — Progress Notes (Signed)
Denies allergies to eggs or soy products. Denies complication of anesthesia or sedation. Denies use of weight loss medication. Denies use of O2.   Emmi instructions given for colonoscopy.  

## 2021-06-07 NOTE — Telephone Encounter (Signed)
Inbound call from pharmacy requesting an alternative for moviprep as patient states it is too expensive $85.

## 2021-06-07 NOTE — Telephone Encounter (Signed)
Called patient, no answer. Left a message today for the patient to contact her pharmacy about her insurance coverage to make sure they have her insurance on file. Then she should call us back about the prep.

## 2021-06-09 NOTE — Telephone Encounter (Signed)
Spoke with the patient. She will pay the amount for the movi prep she did not want to switch to [employer]. Gave her coupon information that she might can google to use to help.pt will call us back if needed.

## 2021-06-14 ENCOUNTER — Encounter: Payer: Self-pay | Admitting: Family Medicine

## 2021-06-19 ENCOUNTER — Ambulatory Visit (AMBULATORY_SURGERY_CENTER): Payer: BC Managed Care – PPO | Admitting: Gastroenterology

## 2021-06-19 ENCOUNTER — Encounter: Payer: Self-pay | Admitting: Gastroenterology

## 2021-06-19 ENCOUNTER — Other Ambulatory Visit: Payer: Self-pay

## 2021-06-19 VITALS — BP 129/78 | HR 69 | Temp 98.4°F | Resp 16 | Ht 66.75 in | Wt 194.0 lb

## 2021-06-19 DIAGNOSIS — K635 Polyp of colon: Secondary | ICD-10-CM | POA: Diagnosis not present

## 2021-06-19 DIAGNOSIS — Z1211 Encounter for screening for malignant neoplasm of colon: Secondary | ICD-10-CM

## 2021-06-19 DIAGNOSIS — D125 Benign neoplasm of sigmoid colon: Secondary | ICD-10-CM

## 2021-06-19 MED ORDER — SODIUM CHLORIDE 0.9 % IV SOLN: 500.0000 mL | Freq: Once | INTRAVENOUS | Status: AC

## 2021-06-19 NOTE — Patient Instructions (Signed)
Resume previous medications.  1 polyp removed and sent to pathology.  Await pathology for final recommendations.  Handouts on findings given to patient.      YOU HAD AN ENDOSCOPIC PROCEDURE TODAY AT [facility]:   Refer to the procedure report that was given to you for any specific questions about what was found during the examination.  If the procedure report does not answer your questions, please call your gastroenterologist to clarify.  If you requested that your care partner not be given the details of your procedure findings, then the procedure report has been included in a sealed envelope for you to review at your convenience later.  YOU SHOULD EXPECT: Some feelings of bloating in the abdomen. Passage of more gas than usual.  Walking can help get rid of the air that was put into your GI tract during the procedure and reduce the bloating. If you had a lower endoscopy (such as a colonoscopy or flexible sigmoidoscopy) you may notice spotting of blood in your stool or on the toilet paper. If you underwent a bowel prep for your procedure, you may not have a normal bowel movement for a few days.  Please Note:  You might notice some irritation and congestion in your nose or some drainage.  This is from the oxygen used during your procedure.  There is no need for concern and it should clear up in a day or so.  SYMPTOMS TO REPORT IMMEDIATELY:  Following lower endoscopy (colonoscopy or flexible sigmoidoscopy):  Excessive amounts of blood in the stool  Significant tenderness or worsening of abdominal pains  Swelling of the abdomen that is new, acute  Fever of 100F or higher   For urgent or emergent issues, a gastroenterologist can be reached at any hour by calling [phone]. Do not use MyChart messaging for urgent concerns.    DIET:  We do recommend a small meal at first, but then you may proceed to your regular diet.  Drink plenty of fluids but you should avoid alcoholic  beverages for 24 hours.  ACTIVITY:  You should plan to take it easy for the rest of today and you should NOT DRIVE or use heavy machinery until tomorrow (because of the sedation medicines used during the test).    FOLLOW UP: Our staff will call the number listed on your records 48-72 hours following your procedure to check on you and address any questions or concerns that you may have regarding the information given to you following your procedure. If we do not reach you, we will leave a message.  We will attempt to reach you two times.  During this call, we will ask if you have developed any symptoms of COVID 19. If you develop any symptoms (ie: fever, flu-like symptoms, shortness of breath, cough etc.) before then, please call [phone].  If you test positive for Covid 19 in the 2 weeks post procedure, please call and report this information to [place].    If any biopsies were taken you will be contacted by phone or by letter within the next 1-3 weeks.  Please call us at [phone] if you have not heard about the biopsies in 3 weeks.    SIGNATURES/CONFIDENTIALITY: You and/or your care partner have signed paperwork which will be entered into your electronic medical record.  These signatures attest to the fact that that the information above on your After Visit Summary has been reviewed and is understood.  Full responsibility of the confidentiality  of this discharge information lies with you and/or your care-partner.  

## 2021-06-19 NOTE — Op Note (Signed)
[facility] Patient Name: Christine Estrada Procedure Date: 06/19/2021 7:21 AM MRN: [id number] Endoscopist: Ladene Artist , MD Age: 50 Referring MD:  Date of Birth: 01-19-1971 Gender: Female Account #: [id number] Procedure:                Colonoscopy Indications:              Screening for colorectal malignant neoplasm Medicines:                Monitored Anesthesia Care Procedure:                Pre-Anesthesia Assessment:                           - Prior to the procedure, a History and Physical                            was performed, and patient medications and                            allergies were reviewed. The patient's tolerance of                            previous anesthesia was also reviewed. The risks                            and benefits of the procedure and the sedation                            options and risks were discussed with the patient.                            All questions were answered, and informed consent                            was obtained. Prior Anticoagulants: The patient has                            taken no previous anticoagulant or antiplatelet                            agents. ASA Grade Assessment: I - A normal, healthy                            patient. After reviewing the risks and benefits,                            the patient was deemed in satisfactory condition to                            undergo the procedure.                           After obtaining informed consent, the colonoscope  was passed under direct vision. Throughout the                            procedure, the patient's blood pressure, pulse, and                            oxygen saturations were monitored continuously. The                            Olympus CF-HQ190L ([id number]) Colonoscope was                            introduced through the anus and advanced to the the                            cecum, identified by  appendiceal orifice and                            ileocecal valve. The ileocecal valve, appendiceal                            orifice, and rectum were photographed. The quality                            of the bowel preparation was excellent. The                            colonoscopy was performed without difficulty. The                            patient tolerated the procedure well. Scope In: 8:06:53 AM Scope Out: 8:17:57 AM Scope Withdrawal Time: 0 hours 9 minutes 5 seconds  Total Procedure Duration: 0 hours 11 minutes 4 seconds  Findings:                 The perianal and digital rectal examinations were                            normal.                           A 3 mm polyp was found in the sigmoid colon. The                            polyp was sessile. The polyp was removed with a                            cold biopsy forceps. Resection and retrieval were                            complete.                           The exam was otherwise without abnormality on  direct and retroflexion views. Complications:            No immediate complications. Estimated blood loss:                            None. Estimated Blood Loss:     Estimated blood loss: none. Impression:               - One 3 mm polyp in the sigmoid colon, removed with                            a cold biopsy forceps. Resected and retrieved.                           - The examination was otherwise normal on direct                            and retroflexion views. Recommendation:           - Repeat colonoscopy after studies are complete for                            surveillance based on pathology results.                           - Patient has a contact number available for                            emergencies. The signs and symptoms of potential                            delayed complications were discussed with the                            patient. Return to normal activities  tomorrow.                            Written discharge instructions were provided to the                            patient.                           - Resume previous diet.                           - Continue present medications.                           - Await pathology results. Ladene Artist, MD 06/19/2021 8:21:25 AM This report has been signed electronically.

## 2021-06-19 NOTE — Progress Notes (Signed)
Pt's states no medical or surgical changes since previsit or office visit.   Check-in-JB  V/S-CW

## 2021-06-19 NOTE — Progress Notes (Signed)
A and O x3. Report to RN. Tolerated MAC anesthesia well. 

## 2021-06-19 NOTE — Progress Notes (Signed)
Called to room to assist during endoscopic procedure.  Patient ID and intended procedure confirmed with present staff. Received instructions for my participation in the procedure from the performing physician.  

## 2021-06-21 ENCOUNTER — Telehealth: Payer: Self-pay | Admitting: *Deleted

## 2021-06-21 NOTE — Telephone Encounter (Signed)
  Follow up Call-  Call back number 06/19/2021  Post procedure Call Back phone  # [phone]  Permission to leave phone message Yes  Some recent data might be hidden     Patient questions:  Do you have a fever, pain , or abdominal swelling? No. Pain Score  0 *  Have you tolerated food without any problems? Yes.    Have you been able to return to your normal activities? Yes.    Do you have any questions about your discharge instructions: Diet   No. Medications  No. Follow up visit  No.  Do you have questions or concerns about your Care? No.  Actions: * If pain score is 4 or above: No action needed, pain <4.  Have you developed a fever since your procedure? no  2.   Have you had an respiratory symptoms (SOB or cough) since your procedure? no  3.   Have you tested positive for COVID 19 since your procedure no  4.   Have you had any family members/close contacts diagnosed with the COVID 19 since your procedure?  no   If yes to any of these questions please route to Joylene John, RN and Joella Prince, RN

## 2021-06-27 ENCOUNTER — Telehealth: Payer: Self-pay

## 2021-06-27 DIAGNOSIS — R922 Inconclusive mammogram: Secondary | ICD-10-CM | POA: Diagnosis not present

## 2021-06-27 DIAGNOSIS — R928 Other abnormal and inconclusive findings on diagnostic imaging of breast: Secondary | ICD-10-CM | POA: Diagnosis not present

## 2021-06-27 LAB — HM MAMMOGRAPHY

## 2021-06-27 NOTE — Telephone Encounter (Signed)
Called patient and she states the lower abdominal/pelvic area cramping started about 1 hour ago, she was just sitting. Says it is better now and is having no other symptoms. She will call us if symptoms return or worsen.

## 2021-06-29 ENCOUNTER — Encounter: Payer: Self-pay | Admitting: Internal Medicine

## 2021-07-06 ENCOUNTER — Ambulatory Visit: Payer: BC Managed Care – PPO | Admitting: Sports Medicine

## 2021-07-06 ENCOUNTER — Other Ambulatory Visit: Payer: Self-pay

## 2021-07-06 VITALS — Ht 66.0 in | Wt 194.0 lb

## 2021-07-06 DIAGNOSIS — S66118A Strain of flexor muscle, fascia and tendon of other finger at wrist and hand level, initial encounter: Secondary | ICD-10-CM

## 2021-07-06 MED ORDER — MELOXICAM 15 MG PO TABS: ORAL_TABLET | ORAL | 0 refills | Status: AC

## 2021-07-06 NOTE — Patient Instructions (Signed)
See me back here next Thursday

## 2021-07-06 NOTE — Progress Notes (Signed)
   Subjective:    Patient ID: Christine Estrada, female    DOB: [birth year], 50 y.o.   MRN: [id number]  HPI chief complaint: Right middle finger pain  Christine Estrada is a very pleasant 49 year old left-hand-dominant female that comes in today after having injured her right middle finger earlier today.  She was pulling on a desk when she felt a pop in the palmar aspect of her hand.  She localizes the pop to the area of the MCP joint.  She has noticed some mild swelling.  She denies any previous injury to this finger in the past.  She denies pain elsewhere in the hand.  Past medical history reviewed Medications reviewed Allergies reviewed    Review of Systems As above    Objective:   Physical Exam  Well-developed, well-nourished.  No acute distress  Examination of the right hand with attention to the right middle finger shows mild swelling at the palmar aspect of the PIP joint.  No ecchymosis.  She has full flexion at the MCP, PIP, and DIP joints.  Good strength against resistance at those joints as well although she does have reproducible pain with resistance at the PIP joint.  There is no palpable defect.  No MCP joint effusion.  No bowstringing.  Brisk capillary refill.  Bedside ultrasound of the right middle flexor tendons shows some hypoechoic changes in the flexor tendon between the MCP and PIP joints but the majority of the tendon appears to be intact.       Assessment & Plan:   Right middle finger pain secondary to flexor tendon strain  Christine Estrada has full flexion at MCP, PIP, and DIP joints and good strength against resistance.  I think she has suffered a strain of the flexor tendon between the MCP and PIP joint.  There may be an injury to the pulley as well but there is no obvious bowstringing.  I recommended buddy taping of the second and third digits for the next 7 days we will couple that with meloxicam 15 mg daily.  Follow-up with me in 1 week for reevaluation.  If symptoms are not  improving then consider referral to Dr. Burney Gauze for further work-up and treatment.

## 2021-07-07 ENCOUNTER — Encounter: Payer: Self-pay | Admitting: Gastroenterology

## 2021-07-13 ENCOUNTER — Ambulatory Visit: Payer: BC Managed Care – PPO | Admitting: Sports Medicine

## 2021-08-16 DIAGNOSIS — Z6831 Body mass index (BMI) 31.0-31.9, adult: Secondary | ICD-10-CM | POA: Diagnosis not present

## 2021-08-16 DIAGNOSIS — R6882 Decreased libido: Secondary | ICD-10-CM | POA: Diagnosis not present

## 2021-08-16 DIAGNOSIS — Z01419 Encounter for gynecological examination (general) (routine) without abnormal findings: Secondary | ICD-10-CM | POA: Diagnosis not present

## 2021-08-16 DIAGNOSIS — N951 Menopausal and female climacteric states: Secondary | ICD-10-CM | POA: Diagnosis not present

## 2021-08-29 ENCOUNTER — Encounter: Payer: Self-pay | Admitting: Family Medicine

## 2021-10-20 ENCOUNTER — Other Ambulatory Visit: Payer: Self-pay | Admitting: Internal Medicine

## 2021-11-06 DIAGNOSIS — Z6829 Body mass index (BMI) 29.0-29.9, adult: Secondary | ICD-10-CM | POA: Diagnosis not present

## 2021-11-06 DIAGNOSIS — R0602 Shortness of breath: Secondary | ICD-10-CM | POA: Diagnosis not present

## 2021-11-06 DIAGNOSIS — Z79899 Other long term (current) drug therapy: Secondary | ICD-10-CM | POA: Diagnosis not present

## 2021-11-06 DIAGNOSIS — E8881 Metabolic syndrome: Secondary | ICD-10-CM | POA: Diagnosis not present

## 2021-11-06 DIAGNOSIS — E7800 Pure hypercholesterolemia, unspecified: Secondary | ICD-10-CM | POA: Diagnosis not present

## 2021-11-06 DIAGNOSIS — E559 Vitamin D deficiency, unspecified: Secondary | ICD-10-CM | POA: Diagnosis not present

## 2021-11-06 DIAGNOSIS — E663 Overweight: Secondary | ICD-10-CM | POA: Diagnosis not present

## 2021-11-06 DIAGNOSIS — E039 Hypothyroidism, unspecified: Secondary | ICD-10-CM | POA: Diagnosis not present

## 2021-11-22 ENCOUNTER — Other Ambulatory Visit: Payer: Self-pay

## 2021-11-22 MED ORDER — LEVOTHYROXINE SODIUM 50 MCG PO TABS: 50.0000 ug | ORAL_TABLET | Freq: Every day | ORAL | 5 refills | Status: AC

## 2021-12-06 DIAGNOSIS — E663 Overweight: Secondary | ICD-10-CM | POA: Diagnosis not present

## 2021-12-06 DIAGNOSIS — E039 Hypothyroidism, unspecified: Secondary | ICD-10-CM | POA: Diagnosis not present

## 2021-12-06 DIAGNOSIS — Z6828 Body mass index (BMI) 28.0-28.9, adult: Secondary | ICD-10-CM | POA: Diagnosis not present

## 2021-12-06 DIAGNOSIS — Z1211 Encounter for screening for malignant neoplasm of colon: Secondary | ICD-10-CM | POA: Diagnosis not present

## 2021-12-06 DIAGNOSIS — Z6829 Body mass index (BMI) 29.0-29.9, adult: Secondary | ICD-10-CM | POA: Diagnosis not present

## 2021-12-06 DIAGNOSIS — E8881 Metabolic syndrome: Secondary | ICD-10-CM | POA: Diagnosis not present

## 2022-01-01 DIAGNOSIS — N6311 Unspecified lump in the right breast, upper outer quadrant: Secondary | ICD-10-CM | POA: Diagnosis not present

## 2022-01-01 DIAGNOSIS — R928 Other abnormal and inconclusive findings on diagnostic imaging of breast: Secondary | ICD-10-CM | POA: Diagnosis not present

## 2022-01-01 DIAGNOSIS — N6312 Unspecified lump in the right breast, upper inner quadrant: Secondary | ICD-10-CM | POA: Diagnosis not present

## 2022-01-08 DIAGNOSIS — E663 Overweight: Secondary | ICD-10-CM | POA: Diagnosis not present

## 2022-01-08 DIAGNOSIS — E7800 Pure hypercholesterolemia, unspecified: Secondary | ICD-10-CM | POA: Diagnosis not present

## 2022-01-08 DIAGNOSIS — E039 Hypothyroidism, unspecified: Secondary | ICD-10-CM | POA: Diagnosis not present

## 2022-02-05 DIAGNOSIS — E039 Hypothyroidism, unspecified: Secondary | ICD-10-CM | POA: Diagnosis not present

## 2022-02-05 DIAGNOSIS — Z79899 Other long term (current) drug therapy: Secondary | ICD-10-CM | POA: Diagnosis not present

## 2022-02-05 DIAGNOSIS — E559 Vitamin D deficiency, unspecified: Secondary | ICD-10-CM | POA: Diagnosis not present

## 2022-02-05 DIAGNOSIS — Z20822 Contact with and (suspected) exposure to covid-19: Secondary | ICD-10-CM | POA: Diagnosis not present

## 2022-02-05 DIAGNOSIS — Z1159 Encounter for screening for other viral diseases: Secondary | ICD-10-CM | POA: Diagnosis not present

## 2022-02-05 DIAGNOSIS — E663 Overweight: Secondary | ICD-10-CM | POA: Diagnosis not present

## 2022-02-05 DIAGNOSIS — Z131 Encounter for screening for diabetes mellitus: Secondary | ICD-10-CM | POA: Diagnosis not present

## 2022-02-05 DIAGNOSIS — Z0000 Encounter for general adult medical examination without abnormal findings: Secondary | ICD-10-CM | POA: Diagnosis not present

## 2022-02-05 DIAGNOSIS — Z1339 Encounter for screening examination for other mental health and behavioral disorders: Secondary | ICD-10-CM | POA: Diagnosis not present

## 2022-02-08 ENCOUNTER — Encounter: Payer: BC Managed Care – PPO | Admitting: Family Medicine

## 2022-02-08 ENCOUNTER — Encounter: Payer: BC Managed Care – PPO | Admitting: Physician Assistant

## 2022-04-10 DIAGNOSIS — E7800 Pure hypercholesterolemia, unspecified: Secondary | ICD-10-CM | POA: Diagnosis not present

## 2022-04-10 DIAGNOSIS — E559 Vitamin D deficiency, unspecified: Secondary | ICD-10-CM | POA: Diagnosis not present

## 2022-04-10 DIAGNOSIS — E039 Hypothyroidism, unspecified: Secondary | ICD-10-CM | POA: Diagnosis not present

## 2022-04-10 DIAGNOSIS — E663 Overweight: Secondary | ICD-10-CM | POA: Diagnosis not present

## 2022-06-22 DIAGNOSIS — G43019 Migraine without aura, intractable, without status migrainosus: Secondary | ICD-10-CM | POA: Diagnosis not present

## 2022-06-22 DIAGNOSIS — E7800 Pure hypercholesterolemia, unspecified: Secondary | ICD-10-CM | POA: Diagnosis not present

## 2022-06-22 DIAGNOSIS — Z79899 Other long term (current) drug therapy: Secondary | ICD-10-CM | POA: Diagnosis not present

## 2022-06-22 DIAGNOSIS — E039 Hypothyroidism, unspecified: Secondary | ICD-10-CM | POA: Diagnosis not present

## 2022-06-22 DIAGNOSIS — E559 Vitamin D deficiency, unspecified: Secondary | ICD-10-CM | POA: Diagnosis not present

## 2022-06-22 DIAGNOSIS — N1831 Chronic kidney disease, stage 3a: Secondary | ICD-10-CM | POA: Diagnosis not present

## 2022-09-26 ENCOUNTER — Other Ambulatory Visit: Payer: Self-pay | Admitting: Medical

## 2022-09-28 ENCOUNTER — Other Ambulatory Visit: Payer: Self-pay | Admitting: Medical

## 2022-10-22 DIAGNOSIS — Z01419 Encounter for gynecological examination (general) (routine) without abnormal findings: Secondary | ICD-10-CM | POA: Diagnosis not present

## 2022-10-22 DIAGNOSIS — E559 Vitamin D deficiency, unspecified: Secondary | ICD-10-CM | POA: Diagnosis not present

## 2022-10-22 DIAGNOSIS — E7800 Pure hypercholesterolemia, unspecified: Secondary | ICD-10-CM | POA: Diagnosis not present

## 2022-10-22 DIAGNOSIS — Z131 Encounter for screening for diabetes mellitus: Secondary | ICD-10-CM | POA: Diagnosis not present

## 2022-10-22 DIAGNOSIS — Z6827 Body mass index (BMI) 27.0-27.9, adult: Secondary | ICD-10-CM | POA: Diagnosis not present

## 2022-10-22 DIAGNOSIS — E039 Hypothyroidism, unspecified: Secondary | ICD-10-CM | POA: Diagnosis not present

## 2022-10-22 DIAGNOSIS — Z79899 Other long term (current) drug therapy: Secondary | ICD-10-CM | POA: Diagnosis not present

## 2022-10-22 DIAGNOSIS — R030 Elevated blood-pressure reading, without diagnosis of hypertension: Secondary | ICD-10-CM | POA: Diagnosis not present

## 2023-02-20 ENCOUNTER — Other Ambulatory Visit (HOSPITAL_COMMUNITY): Payer: Self-pay

## 2023-02-21 ENCOUNTER — Other Ambulatory Visit (HOSPITAL_COMMUNITY): Payer: Self-pay

## 2023-02-21 MED ORDER — ZEPBOUND 2.5 MG/0.5ML ~~LOC~~ SOAJ
2.5000 mg | SUBCUTANEOUS | 0 refills | Status: AC
  Filled 2023-02-21: qty 2, 28d supply, fill #0

## 2023-04-10 ENCOUNTER — Other Ambulatory Visit: Payer: Self-pay

## 2023-04-10 MED ORDER — TIRZEPATIDE-WEIGHT MANAGEMENT 7.5 MG/0.5ML ~~LOC~~ SOAJ
7.5000 mg | SUBCUTANEOUS | 0 refills | Status: AC
  Filled 2023-04-10: qty 2, 28d supply, fill #0

## 2023-05-13 ENCOUNTER — Other Ambulatory Visit: Payer: Self-pay

## 2023-05-21 ENCOUNTER — Other Ambulatory Visit (HOSPITAL_BASED_OUTPATIENT_CLINIC_OR_DEPARTMENT_OTHER): Payer: Self-pay

## 2023-05-21 MED ORDER — ZEPBOUND 10 MG/0.5ML ~~LOC~~ SOAJ
10.0000 mg | SUBCUTANEOUS | 0 refills | Status: AC
  Filled 2023-05-21 – 2023-05-22 (×3): qty 2, 28d supply, fill #0

## 2023-05-22 ENCOUNTER — Other Ambulatory Visit (HOSPITAL_BASED_OUTPATIENT_CLINIC_OR_DEPARTMENT_OTHER): Payer: Self-pay

## 2023-05-24 ENCOUNTER — Other Ambulatory Visit (HOSPITAL_BASED_OUTPATIENT_CLINIC_OR_DEPARTMENT_OTHER): Payer: Self-pay

## 2023-06-18 ENCOUNTER — Other Ambulatory Visit (HOSPITAL_BASED_OUTPATIENT_CLINIC_OR_DEPARTMENT_OTHER): Payer: Self-pay

## 2023-06-18 MED ORDER — ZEPBOUND 12.5 MG/0.5ML ~~LOC~~ SOAJ
12.5000 mg | SUBCUTANEOUS | 0 refills | Status: DC
  Filled 2023-06-18: qty 2, 28d supply, fill #0

## 2023-06-21 ENCOUNTER — Other Ambulatory Visit (HOSPITAL_BASED_OUTPATIENT_CLINIC_OR_DEPARTMENT_OTHER): Payer: Self-pay

## 2023-06-26 ENCOUNTER — Ambulatory Visit: Payer: BC Managed Care – PPO | Admitting: Family Medicine

## 2023-07-16 ENCOUNTER — Other Ambulatory Visit (HOSPITAL_BASED_OUTPATIENT_CLINIC_OR_DEPARTMENT_OTHER): Payer: Self-pay

## 2023-07-16 MED ORDER — ZEPBOUND 12.5 MG/0.5ML ~~LOC~~ SOAJ
12.5000 mg | SUBCUTANEOUS | 0 refills | Status: DC
  Filled 2023-07-16: qty 2, 28d supply, fill #0

## 2023-07-19 ENCOUNTER — Encounter (HOSPITAL_BASED_OUTPATIENT_CLINIC_OR_DEPARTMENT_OTHER): Payer: Self-pay

## 2023-07-19 ENCOUNTER — Other Ambulatory Visit (HOSPITAL_BASED_OUTPATIENT_CLINIC_OR_DEPARTMENT_OTHER): Payer: Self-pay

## 2023-07-23 ENCOUNTER — Other Ambulatory Visit (HOSPITAL_BASED_OUTPATIENT_CLINIC_OR_DEPARTMENT_OTHER): Payer: Self-pay

## 2023-07-24 ENCOUNTER — Other Ambulatory Visit (HOSPITAL_BASED_OUTPATIENT_CLINIC_OR_DEPARTMENT_OTHER): Payer: Self-pay

## 2023-07-26 ENCOUNTER — Other Ambulatory Visit (HOSPITAL_BASED_OUTPATIENT_CLINIC_OR_DEPARTMENT_OTHER): Payer: Self-pay

## 2023-07-29 ENCOUNTER — Other Ambulatory Visit (HOSPITAL_BASED_OUTPATIENT_CLINIC_OR_DEPARTMENT_OTHER): Payer: Self-pay

## 2023-07-30 ENCOUNTER — Other Ambulatory Visit (HOSPITAL_BASED_OUTPATIENT_CLINIC_OR_DEPARTMENT_OTHER): Payer: Self-pay

## 2023-08-13 ENCOUNTER — Encounter (HOSPITAL_BASED_OUTPATIENT_CLINIC_OR_DEPARTMENT_OTHER): Payer: Self-pay

## 2023-08-13 ENCOUNTER — Other Ambulatory Visit (HOSPITAL_BASED_OUTPATIENT_CLINIC_OR_DEPARTMENT_OTHER): Payer: Self-pay

## 2023-08-13 MED ORDER — ZEPBOUND 15 MG/0.5ML ~~LOC~~ SOAJ
15.0000 mg | SUBCUTANEOUS | 0 refills | Status: DC
  Filled 2023-08-13 – 2023-08-23 (×2): qty 2, 28d supply, fill #0

## 2023-08-23 ENCOUNTER — Other Ambulatory Visit (HOSPITAL_BASED_OUTPATIENT_CLINIC_OR_DEPARTMENT_OTHER): Payer: Self-pay

## 2023-09-11 ENCOUNTER — Other Ambulatory Visit (HOSPITAL_BASED_OUTPATIENT_CLINIC_OR_DEPARTMENT_OTHER): Payer: Self-pay

## 2023-09-11 MED ORDER — LEVOTHYROXINE SODIUM 50 MCG PO TABS
50.0000 ug | ORAL_TABLET | Freq: Every day | ORAL | 0 refills | Status: DC
  Filled 2023-09-11 – 2023-10-13 (×3): qty 90, 90d supply, fill #0

## 2023-09-11 MED ORDER — ZEPBOUND 15 MG/0.5ML ~~LOC~~ SOAJ
15.0000 mg | SUBCUTANEOUS | 0 refills | Status: DC
  Filled 2023-09-11 – 2023-09-15 (×3): qty 2, 28d supply, fill #0

## 2023-09-16 ENCOUNTER — Other Ambulatory Visit: Payer: Self-pay

## 2023-09-16 ENCOUNTER — Other Ambulatory Visit (HOSPITAL_BASED_OUTPATIENT_CLINIC_OR_DEPARTMENT_OTHER): Payer: Self-pay

## 2023-10-09 ENCOUNTER — Other Ambulatory Visit (HOSPITAL_BASED_OUTPATIENT_CLINIC_OR_DEPARTMENT_OTHER): Payer: Self-pay

## 2023-10-09 MED ORDER — ZEPBOUND 15 MG/0.5ML ~~LOC~~ SOAJ
15.0000 mg | SUBCUTANEOUS | 0 refills | Status: DC
  Filled 2023-10-13 – 2023-10-16 (×2): qty 2, 28d supply, fill #0

## 2023-10-14 ENCOUNTER — Other Ambulatory Visit (HOSPITAL_BASED_OUTPATIENT_CLINIC_OR_DEPARTMENT_OTHER): Payer: Self-pay

## 2023-10-14 ENCOUNTER — Other Ambulatory Visit: Payer: Self-pay

## 2023-10-16 ENCOUNTER — Other Ambulatory Visit (HOSPITAL_BASED_OUTPATIENT_CLINIC_OR_DEPARTMENT_OTHER): Payer: Self-pay

## 2023-10-28 ENCOUNTER — Other Ambulatory Visit (HOSPITAL_BASED_OUTPATIENT_CLINIC_OR_DEPARTMENT_OTHER): Payer: Self-pay

## 2023-10-28 MED ORDER — D3 5000 125 MCG (5000 UT) PO CAPS
5000.0000 [IU] | ORAL_CAPSULE | Freq: Every day | ORAL | 3 refills | Status: AC
  Filled 2023-10-28: qty 100, 90d supply, fill #0
  Filled 2024-04-03: qty 100, 90d supply, fill #1
  Filled 2024-07-02: qty 100, 90d supply, fill #2
  Filled 2024-10-16: qty 100, 100d supply, fill #3

## 2023-11-06 ENCOUNTER — Other Ambulatory Visit (HOSPITAL_BASED_OUTPATIENT_CLINIC_OR_DEPARTMENT_OTHER): Payer: Self-pay

## 2023-11-06 MED ORDER — VALACYCLOVIR HCL 1 G PO TABS
1000.0000 mg | ORAL_TABLET | Freq: Every day | ORAL | 1 refills | Status: AC
  Filled 2023-11-06 – 2023-11-19 (×2): qty 90, 90d supply, fill #0

## 2023-11-06 MED ORDER — ONDANSETRON 4 MG PO TBDP
4.0000 mg | ORAL_TABLET | Freq: Three times a day (TID) | ORAL | 0 refills | Status: DC | PRN
  Filled 2023-11-06 – 2023-11-19 (×2): qty 45, 15d supply, fill #0

## 2023-11-06 MED ORDER — ZEPBOUND 15 MG/0.5ML ~~LOC~~ SOAJ
15.0000 mg | SUBCUTANEOUS | 0 refills | Status: AC
  Filled 2023-11-13: qty 2, 28d supply, fill #0

## 2023-11-07 ENCOUNTER — Other Ambulatory Visit (HOSPITAL_BASED_OUTPATIENT_CLINIC_OR_DEPARTMENT_OTHER): Payer: Self-pay

## 2023-11-14 ENCOUNTER — Other Ambulatory Visit (HOSPITAL_BASED_OUTPATIENT_CLINIC_OR_DEPARTMENT_OTHER): Payer: Self-pay

## 2023-11-18 ENCOUNTER — Other Ambulatory Visit (HOSPITAL_BASED_OUTPATIENT_CLINIC_OR_DEPARTMENT_OTHER): Payer: Self-pay

## 2023-11-19 ENCOUNTER — Other Ambulatory Visit (HOSPITAL_BASED_OUTPATIENT_CLINIC_OR_DEPARTMENT_OTHER): Payer: Self-pay

## 2023-12-06 ENCOUNTER — Other Ambulatory Visit (HOSPITAL_BASED_OUTPATIENT_CLINIC_OR_DEPARTMENT_OTHER): Payer: Self-pay

## 2023-12-06 MED ORDER — MOUNJARO 12.5 MG/0.5ML ~~LOC~~ SOAJ
12.5000 mg | SUBCUTANEOUS | 0 refills | Status: AC
  Filled 2023-12-06: qty 6, 84d supply, fill #0

## 2023-12-09 ENCOUNTER — Other Ambulatory Visit (HOSPITAL_BASED_OUTPATIENT_CLINIC_OR_DEPARTMENT_OTHER): Payer: Self-pay

## 2023-12-09 MED ORDER — ZEPBOUND 12.5 MG/0.5ML ~~LOC~~ SOAJ
12.5000 mg | SUBCUTANEOUS | 0 refills | Status: DC
  Filled 2023-12-09: qty 2, 28d supply, fill #0

## 2023-12-10 ENCOUNTER — Other Ambulatory Visit (HOSPITAL_BASED_OUTPATIENT_CLINIC_OR_DEPARTMENT_OTHER): Payer: Self-pay

## 2023-12-11 ENCOUNTER — Other Ambulatory Visit (HOSPITAL_BASED_OUTPATIENT_CLINIC_OR_DEPARTMENT_OTHER): Payer: Self-pay

## 2024-01-03 ENCOUNTER — Other Ambulatory Visit (HOSPITAL_BASED_OUTPATIENT_CLINIC_OR_DEPARTMENT_OTHER): Payer: Self-pay

## 2024-01-03 MED ORDER — ZEPBOUND 12.5 MG/0.5ML ~~LOC~~ SOAJ
12.5000 mg | SUBCUTANEOUS | 0 refills | Status: DC
  Filled 2024-01-03: qty 2, 28d supply, fill #0

## 2024-01-12 ENCOUNTER — Other Ambulatory Visit (HOSPITAL_BASED_OUTPATIENT_CLINIC_OR_DEPARTMENT_OTHER): Payer: Self-pay

## 2024-01-13 ENCOUNTER — Other Ambulatory Visit (HOSPITAL_BASED_OUTPATIENT_CLINIC_OR_DEPARTMENT_OTHER): Payer: Self-pay

## 2024-01-13 ENCOUNTER — Other Ambulatory Visit: Payer: Self-pay

## 2024-01-13 MED ORDER — LEVOTHYROXINE SODIUM 50 MCG PO TABS
50.0000 ug | ORAL_TABLET | Freq: Every day | ORAL | 0 refills | Status: DC
  Filled 2024-01-13 – 2024-02-18 (×2): qty 90, 90d supply, fill #0

## 2024-01-23 ENCOUNTER — Other Ambulatory Visit (HOSPITAL_BASED_OUTPATIENT_CLINIC_OR_DEPARTMENT_OTHER): Payer: Self-pay

## 2024-01-31 ENCOUNTER — Other Ambulatory Visit (HOSPITAL_BASED_OUTPATIENT_CLINIC_OR_DEPARTMENT_OTHER): Payer: Self-pay

## 2024-01-31 MED ORDER — ZEPBOUND 10 MG/0.5ML ~~LOC~~ SOAJ
10.0000 mg | SUBCUTANEOUS | 0 refills | Status: DC
Start: 2024-01-31 — End: 2024-03-02
  Filled 2024-01-31: qty 2, 28d supply, fill #0

## 2024-02-18 ENCOUNTER — Other Ambulatory Visit (HOSPITAL_BASED_OUTPATIENT_CLINIC_OR_DEPARTMENT_OTHER): Payer: Self-pay

## 2024-02-26 ENCOUNTER — Other Ambulatory Visit (HOSPITAL_BASED_OUTPATIENT_CLINIC_OR_DEPARTMENT_OTHER): Payer: Self-pay

## 2024-02-26 MED ORDER — POLYMYXIN B-TRIMETHOPRIM 10000-0.1 UNIT/ML-% OP SOLN
OPHTHALMIC | 0 refills | Status: AC
Start: 2024-02-26 — End: ?
  Filled 2024-02-26: qty 10, 7d supply, fill #0

## 2024-03-02 ENCOUNTER — Other Ambulatory Visit (HOSPITAL_BASED_OUTPATIENT_CLINIC_OR_DEPARTMENT_OTHER): Payer: Self-pay

## 2024-03-02 MED ORDER — ZEPBOUND 10 MG/0.5ML ~~LOC~~ SOAJ
10.0000 mg | SUBCUTANEOUS | 0 refills | Status: AC
Start: 2024-03-02 — End: ?
  Filled 2024-03-02: qty 2, 28d supply, fill #0

## 2024-03-30 ENCOUNTER — Other Ambulatory Visit (HOSPITAL_BASED_OUTPATIENT_CLINIC_OR_DEPARTMENT_OTHER): Payer: Self-pay

## 2024-03-30 MED ORDER — ZEPBOUND 12.5 MG/0.5ML ~~LOC~~ SOAJ
12.5000 mg | SUBCUTANEOUS | 0 refills | Status: AC
Start: 2024-03-30 — End: ?
  Filled 2024-03-30 – 2024-04-01 (×3): qty 2, 28d supply, fill #0

## 2024-03-31 ENCOUNTER — Other Ambulatory Visit (HOSPITAL_BASED_OUTPATIENT_CLINIC_OR_DEPARTMENT_OTHER): Payer: Self-pay

## 2024-04-01 ENCOUNTER — Other Ambulatory Visit (HOSPITAL_BASED_OUTPATIENT_CLINIC_OR_DEPARTMENT_OTHER): Payer: Self-pay

## 2024-04-03 ENCOUNTER — Other Ambulatory Visit: Payer: Self-pay

## 2024-04-03 ENCOUNTER — Other Ambulatory Visit (HOSPITAL_BASED_OUTPATIENT_CLINIC_OR_DEPARTMENT_OTHER): Payer: Self-pay

## 2024-04-03 MED ORDER — ONDANSETRON 4 MG PO TBDP
4.0000 mg | ORAL_TABLET | Freq: Three times a day (TID) | ORAL | 0 refills | Status: DC | PRN
  Filled 2024-04-03: qty 45, 15d supply, fill #0

## 2024-04-29 ENCOUNTER — Other Ambulatory Visit (HOSPITAL_BASED_OUTPATIENT_CLINIC_OR_DEPARTMENT_OTHER): Payer: Self-pay

## 2024-04-29 MED ORDER — ZEPBOUND 15 MG/0.5ML ~~LOC~~ SOAJ
15.0000 mg | SUBCUTANEOUS | 0 refills | Status: DC
  Filled 2024-04-29: qty 2, 28d supply, fill #0

## 2024-05-26 ENCOUNTER — Other Ambulatory Visit (HOSPITAL_BASED_OUTPATIENT_CLINIC_OR_DEPARTMENT_OTHER): Payer: Self-pay

## 2024-05-26 MED ORDER — ZEPBOUND 15 MG/0.5ML ~~LOC~~ SOAJ
15.0000 mg | SUBCUTANEOUS | 0 refills | Status: DC
  Filled 2024-05-26: qty 2, 28d supply, fill #0

## 2024-06-25 ENCOUNTER — Other Ambulatory Visit (HOSPITAL_BASED_OUTPATIENT_CLINIC_OR_DEPARTMENT_OTHER): Payer: Self-pay

## 2024-06-26 ENCOUNTER — Other Ambulatory Visit (HOSPITAL_BASED_OUTPATIENT_CLINIC_OR_DEPARTMENT_OTHER): Payer: Self-pay

## 2024-06-26 MED ORDER — ZEPBOUND 15 MG/0.5ML ~~LOC~~ SOAJ
15.0000 mg | SUBCUTANEOUS | 0 refills | Status: DC
  Filled 2024-06-26: qty 2, 28d supply, fill #0

## 2024-06-26 MED ORDER — ONDANSETRON 4 MG PO TBDP
4.0000 mg | ORAL_TABLET | Freq: Three times a day (TID) | ORAL | 0 refills | Status: DC | PRN
  Filled 2024-06-26: qty 45, 15d supply, fill #0

## 2024-06-29 ENCOUNTER — Other Ambulatory Visit (HOSPITAL_BASED_OUTPATIENT_CLINIC_OR_DEPARTMENT_OTHER): Payer: Self-pay

## 2024-07-02 ENCOUNTER — Other Ambulatory Visit: Payer: Self-pay

## 2024-07-02 ENCOUNTER — Other Ambulatory Visit (HOSPITAL_BASED_OUTPATIENT_CLINIC_OR_DEPARTMENT_OTHER): Payer: Self-pay

## 2024-07-02 MED ORDER — LEVOTHYROXINE SODIUM 50 MCG PO TABS
50.0000 ug | ORAL_TABLET | Freq: Every day | ORAL | 0 refills | Status: DC
  Filled 2024-07-02: qty 90, 90d supply, fill #0

## 2024-07-23 ENCOUNTER — Other Ambulatory Visit (HOSPITAL_BASED_OUTPATIENT_CLINIC_OR_DEPARTMENT_OTHER): Payer: Self-pay

## 2024-07-27 ENCOUNTER — Other Ambulatory Visit (HOSPITAL_BASED_OUTPATIENT_CLINIC_OR_DEPARTMENT_OTHER): Payer: Self-pay

## 2024-07-27 MED ORDER — ZEPBOUND 15 MG/0.5ML ~~LOC~~ SOAJ
15.0000 mg | SUBCUTANEOUS | 0 refills | Status: DC
  Filled 2024-07-27: qty 2, 28d supply, fill #0

## 2024-08-03 ENCOUNTER — Other Ambulatory Visit (HOSPITAL_BASED_OUTPATIENT_CLINIC_OR_DEPARTMENT_OTHER): Payer: Self-pay

## 2024-08-03 ENCOUNTER — Encounter (HOSPITAL_BASED_OUTPATIENT_CLINIC_OR_DEPARTMENT_OTHER): Payer: Self-pay

## 2024-08-03 MED ORDER — LEVOTHYROXINE SODIUM 50 MCG PO TABS
50.0000 ug | ORAL_TABLET | Freq: Every day | ORAL | 0 refills | Status: DC
  Filled 2024-08-03: qty 90, 90d supply, fill #0

## 2024-08-04 ENCOUNTER — Other Ambulatory Visit (HOSPITAL_BASED_OUTPATIENT_CLINIC_OR_DEPARTMENT_OTHER): Payer: Self-pay

## 2024-08-25 ENCOUNTER — Other Ambulatory Visit (HOSPITAL_BASED_OUTPATIENT_CLINIC_OR_DEPARTMENT_OTHER): Payer: Self-pay

## 2024-08-26 ENCOUNTER — Other Ambulatory Visit (HOSPITAL_BASED_OUTPATIENT_CLINIC_OR_DEPARTMENT_OTHER): Payer: Self-pay

## 2024-08-26 MED ORDER — ZEPBOUND 15 MG/0.5ML ~~LOC~~ SOAJ
15.0000 mg | SUBCUTANEOUS | 0 refills | Status: DC
  Filled 2024-08-26: qty 2, 28d supply, fill #0

## 2024-08-27 ENCOUNTER — Other Ambulatory Visit (HOSPITAL_BASED_OUTPATIENT_CLINIC_OR_DEPARTMENT_OTHER): Payer: Self-pay

## 2024-08-27 ENCOUNTER — Encounter (HOSPITAL_BASED_OUTPATIENT_CLINIC_OR_DEPARTMENT_OTHER): Payer: Self-pay

## 2024-08-27 ENCOUNTER — Other Ambulatory Visit: Payer: Self-pay

## 2024-08-27 MED ORDER — ONDANSETRON 4 MG PO TBDP
4.0000 mg | ORAL_TABLET | Freq: Three times a day (TID) | ORAL | 0 refills | Status: AC | PRN
  Filled 2024-08-27: qty 45, 15d supply, fill #0

## 2024-09-17 ENCOUNTER — Other Ambulatory Visit (HOSPITAL_BASED_OUTPATIENT_CLINIC_OR_DEPARTMENT_OTHER): Payer: Self-pay

## 2024-09-19 MED ORDER — TIRZEPATIDE-WEIGHT MANAGEMENT 15 MG/0.5ML ~~LOC~~ SOAJ
15.0000 mg | SUBCUTANEOUS | 0 refills | Status: DC
  Filled 2024-09-19: qty 2, 28d supply, fill #0

## 2024-09-20 ENCOUNTER — Other Ambulatory Visit (HOSPITAL_BASED_OUTPATIENT_CLINIC_OR_DEPARTMENT_OTHER): Payer: Self-pay

## 2024-09-21 ENCOUNTER — Other Ambulatory Visit (HOSPITAL_BASED_OUTPATIENT_CLINIC_OR_DEPARTMENT_OTHER): Payer: Self-pay

## 2024-09-21 ENCOUNTER — Encounter (HOSPITAL_BASED_OUTPATIENT_CLINIC_OR_DEPARTMENT_OTHER): Payer: Self-pay

## 2024-09-24 ENCOUNTER — Other Ambulatory Visit (HOSPITAL_COMMUNITY): Payer: Self-pay

## 2024-09-25 ENCOUNTER — Other Ambulatory Visit (HOSPITAL_BASED_OUTPATIENT_CLINIC_OR_DEPARTMENT_OTHER): Payer: Self-pay

## 2024-09-25 MED ORDER — ZEPBOUND 15 MG/0.5ML ~~LOC~~ SOAJ
15.0000 mg | SUBCUTANEOUS | 0 refills | Status: DC
  Filled 2024-09-25 – 2024-10-16 (×2): qty 2, 28d supply, fill #0

## 2024-10-16 ENCOUNTER — Other Ambulatory Visit (HOSPITAL_BASED_OUTPATIENT_CLINIC_OR_DEPARTMENT_OTHER): Payer: Self-pay

## 2024-10-16 ENCOUNTER — Other Ambulatory Visit: Payer: Self-pay

## 2024-10-26 ENCOUNTER — Other Ambulatory Visit (HOSPITAL_BASED_OUTPATIENT_CLINIC_OR_DEPARTMENT_OTHER): Payer: Self-pay

## 2024-10-26 MED ORDER — LEVOTHYROXINE SODIUM 50 MCG PO TABS
50.0000 ug | ORAL_TABLET | Freq: Every day | ORAL | 0 refills | Status: AC
  Filled 2024-10-26: qty 90, 90d supply, fill #0

## 2024-10-26 MED ORDER — ZEPBOUND 15 MG/0.5ML ~~LOC~~ SOAJ
15.0000 mg | SUBCUTANEOUS | 0 refills | Status: DC
  Filled 2024-11-08 – 2024-11-13 (×2): qty 2, 28d supply, fill #0

## 2024-10-27 ENCOUNTER — Other Ambulatory Visit (HOSPITAL_BASED_OUTPATIENT_CLINIC_OR_DEPARTMENT_OTHER): Payer: Self-pay

## 2024-10-28 ENCOUNTER — Other Ambulatory Visit (HOSPITAL_BASED_OUTPATIENT_CLINIC_OR_DEPARTMENT_OTHER): Payer: Self-pay

## 2024-10-28 MED ORDER — UBRELVY 100 MG PO TABS
100.0000 mg | ORAL_TABLET | Freq: Once | ORAL | 3 refills | Status: AC | PRN
  Filled 2024-10-28: qty 16, 30d supply, fill #0

## 2024-10-29 ENCOUNTER — Other Ambulatory Visit (HOSPITAL_BASED_OUTPATIENT_CLINIC_OR_DEPARTMENT_OTHER): Payer: Self-pay

## 2024-10-30 ENCOUNTER — Other Ambulatory Visit (HOSPITAL_BASED_OUTPATIENT_CLINIC_OR_DEPARTMENT_OTHER): Payer: Self-pay

## 2024-11-02 ENCOUNTER — Other Ambulatory Visit (HOSPITAL_BASED_OUTPATIENT_CLINIC_OR_DEPARTMENT_OTHER): Payer: Self-pay

## 2024-11-04 ENCOUNTER — Other Ambulatory Visit (HOSPITAL_BASED_OUTPATIENT_CLINIC_OR_DEPARTMENT_OTHER): Payer: Self-pay

## 2024-11-04 ENCOUNTER — Encounter (HOSPITAL_BASED_OUTPATIENT_CLINIC_OR_DEPARTMENT_OTHER): Payer: Self-pay

## 2024-11-08 ENCOUNTER — Other Ambulatory Visit (HOSPITAL_BASED_OUTPATIENT_CLINIC_OR_DEPARTMENT_OTHER): Payer: Self-pay

## 2024-11-13 ENCOUNTER — Other Ambulatory Visit (HOSPITAL_BASED_OUTPATIENT_CLINIC_OR_DEPARTMENT_OTHER): Payer: Self-pay

## 2024-11-13 ENCOUNTER — Other Ambulatory Visit: Payer: Self-pay

## 2024-11-24 ENCOUNTER — Other Ambulatory Visit (HOSPITAL_BASED_OUTPATIENT_CLINIC_OR_DEPARTMENT_OTHER): Payer: Self-pay

## 2024-11-24 MED ORDER — ZEPBOUND 15 MG/0.5ML ~~LOC~~ SOAJ
15.0000 mg | SUBCUTANEOUS | 2 refills | Status: AC
  Filled 2024-11-24 – 2024-12-09 (×3): qty 2, 28d supply, fill #0

## 2024-12-07 ENCOUNTER — Other Ambulatory Visit (HOSPITAL_BASED_OUTPATIENT_CLINIC_OR_DEPARTMENT_OTHER): Payer: Self-pay

## 2024-12-09 ENCOUNTER — Other Ambulatory Visit (HOSPITAL_BASED_OUTPATIENT_CLINIC_OR_DEPARTMENT_OTHER): Payer: Self-pay
